# Patient Record
Sex: Male | Born: 1975 | Race: Black or African American | Hispanic: No | Smoking: Current every day smoker
Health system: Southern US, Community
[De-identification: ages and names within clinical notes are randomized; demographics above are authoritative.]

## PROBLEM LIST (undated history)

## (undated) DIAGNOSIS — F32A Depression, unspecified: Secondary | ICD-10-CM

## (undated) DIAGNOSIS — F419 Anxiety disorder, unspecified: Secondary | ICD-10-CM

---

## 2015-05-23 ENCOUNTER — Emergency Department (HOSPITAL_COMMUNITY)
Admission: EM | Admit: 2015-05-23 | Discharge: 2015-05-23 | Disposition: A | Discharge status: Other Acute Inpatient Hospital | Payer: BLUE CROSS/BLUE SHIELD | Attending: Emergency Medicine | Admitting: Emergency Medicine

## 2015-05-23 ENCOUNTER — Encounter (HOSPITAL_COMMUNITY): Payer: Self-pay | Admitting: *Deleted

## 2015-05-23 DIAGNOSIS — T3110 Burns involving 10-19% of body surface with 0% to 9% third degree burns: Secondary | ICD-10-CM

## 2015-05-23 DIAGNOSIS — T23272A Burn of second degree of left wrist, initial encounter: Secondary | ICD-10-CM | POA: Insufficient documentation

## 2015-05-23 DIAGNOSIS — T22221A Burn of second degree of right elbow, initial encounter: Secondary | ICD-10-CM | POA: Diagnosis not present

## 2015-05-23 DIAGNOSIS — X088XXA Exposure to other specified smoke, fire and flames, initial encounter: Secondary | ICD-10-CM | POA: Insufficient documentation

## 2015-05-23 DIAGNOSIS — T2121XA Burn of second degree of chest wall, initial encounter: Secondary | ICD-10-CM | POA: Insufficient documentation

## 2015-05-23 DIAGNOSIS — Y9289 Other specified places as the place of occurrence of the external cause: Secondary | ICD-10-CM | POA: Diagnosis not present

## 2015-05-23 DIAGNOSIS — Z720 Tobacco use: Secondary | ICD-10-CM | POA: Diagnosis not present

## 2015-05-23 DIAGNOSIS — F419 Anxiety disorder, unspecified: Secondary | ICD-10-CM | POA: Diagnosis not present

## 2015-05-23 DIAGNOSIS — T2101XA Burn of unspecified degree of chest wall, initial encounter: Secondary | ICD-10-CM | POA: Diagnosis present

## 2015-05-23 DIAGNOSIS — T23271A Burn of second degree of right wrist, initial encounter: Secondary | ICD-10-CM | POA: Diagnosis not present

## 2015-05-23 DIAGNOSIS — Y93G9 Activity, other involving cooking and grilling: Secondary | ICD-10-CM | POA: Diagnosis not present

## 2015-05-23 DIAGNOSIS — Z79899 Other long term (current) drug therapy: Secondary | ICD-10-CM | POA: Diagnosis not present

## 2015-05-23 DIAGNOSIS — Y998 Other external cause status: Secondary | ICD-10-CM | POA: Insufficient documentation

## 2015-05-23 DIAGNOSIS — T22222A Burn of second degree of left elbow, initial encounter: Secondary | ICD-10-CM | POA: Diagnosis not present

## 2015-05-23 HISTORY — DX: Anxiety disorder, unspecified: F41.9

## 2015-05-23 LAB — COMPREHENSIVE METABOLIC PANEL
ALT: 23 U/L (ref 17–63)
AST: 38 U/L (ref 15–41)
Albumin: 4.2 g/dL (ref 3.5–5.0)
Alkaline Phosphatase: 75 U/L (ref 38–126)
Anion gap: 12 (ref 5–15)
BUN: 8 mg/dL (ref 6–20)
CO2: 21 mmol/L — ABNORMAL LOW (ref 22–32)
CREATININE: 0.69 mg/dL (ref 0.61–1.24)
Calcium: 9.1 mg/dL (ref 8.9–10.3)
Chloride: 106 mmol/L (ref 101–111)
GFR calc Af Amer: >60 mL/min (ref >60)
GFR calc non Af Amer: >60 mL/min (ref >60)
GLUCOSE: 105 mg/dL — AB (ref 65–99)
Potassium: 3.2 mmol/L — ABNORMAL LOW (ref 3.5–5.1)
Sodium: 139 mmol/L (ref 135–145)
Total Bilirubin: 0.7 mg/dL (ref 0.3–1.2)
Total Protein: 7.1 g/dL (ref 6.5–8.1)

## 2015-05-23 LAB — RAPID URINE DRUG SCREEN, HOSP PERFORMED
Amphetamines: NOT DETECTED
BENZODIAZEPINES: NOT DETECTED
Barbiturates: NOT DETECTED
Cocaine: NOT DETECTED
Opiates: NOT DETECTED
Tetrahydrocannabinol: NOT DETECTED

## 2015-05-23 LAB — I-STAT CHEM 8, ED
BUN: 8 mg/dL (ref 6–20)
CALCIUM ION: 1.08 mmol/L — AB (ref 1.12–1.23)
CHLORIDE: 106 mmol/L (ref 101–111)
Creatinine, Ser: 0.9 mg/dL (ref 0.61–1.24)
GLUCOSE: 102 mg/dL — AB (ref 65–99)
HEMATOCRIT: 42 % (ref 39.0–52.0)
HEMOGLOBIN: 14.3 g/dL (ref 13.0–17.0)
Potassium: 3.2 mmol/L — ABNORMAL LOW (ref 3.5–5.1)
Sodium: 141 mmol/L (ref 135–145)
TCO2: 18 mmol/L (ref 0–100)

## 2015-05-23 LAB — PROTIME-INR
INR: 1.09 (ref 0.00–1.49)
Prothrombin Time: 14.3 seconds (ref 11.6–15.2)

## 2015-05-23 LAB — PREPARE FRESH FROZEN PLASMA
UNIT DIVISION: 0
Unit division: 0

## 2015-05-23 LAB — CBC
HCT: 39.3 % (ref 39.0–52.0)
HEMOGLOBIN: 13.6 g/dL (ref 13.0–17.0)
MCH: 32.2 pg (ref 26.0–34.0)
MCHC: 34.6 g/dL (ref 30.0–36.0)
MCV: 93.1 fL (ref 78.0–100.0)
PLATELETS: 277 10*3/uL (ref 150–400)
RBC: 4.22 MIL/uL (ref 4.22–5.81)
RDW: 13.1 % (ref 11.5–15.5)
WBC: 5.7 10*3/uL (ref 4.0–10.5)

## 2015-05-23 LAB — CDS SEROLOGY

## 2015-05-23 LAB — I-STAT CG4 LACTIC ACID, ED: Lactic Acid, Venous: 2.52 mmol/L (ref 0.5–2.0)

## 2015-05-23 MED ORDER — HYDROMORPHONE HCL 1 MG/ML IJ SOLN
1.0000 mg | Freq: Once | INTRAMUSCULAR | Status: AC
  Administered 2015-05-23: 1 mg via INTRAVENOUS
  Filled 2015-05-23: qty 1

## 2015-05-23 MED ORDER — LACTATED RINGERS IV SOLN
INTRAVENOUS | Status: DC
  Administered 2015-05-23: 18:00:00 via INTRAVENOUS
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
## 2015-05-23 NOTE — ED Notes (Signed)
Pt ambulated to the bathroom with ease 
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
## 2015-05-23 NOTE — ED Notes (Signed)
[facility] at bedside 

## 2017-11-30 ENCOUNTER — Emergency Department (HOSPITAL_COMMUNITY)
Admission: EM | Admit: 2017-11-30 | Discharge: 2017-11-30 | Disposition: A | Discharge status: Home / Self Care | Payer: Managed Care, Other (non HMO) | Attending: Emergency Medicine | Admitting: Emergency Medicine

## 2017-11-30 ENCOUNTER — Emergency Department (HOSPITAL_COMMUNITY): Payer: Managed Care, Other (non HMO)

## 2017-11-30 ENCOUNTER — Other Ambulatory Visit: Payer: Self-pay

## 2017-11-30 ENCOUNTER — Encounter (HOSPITAL_COMMUNITY): Payer: Self-pay | Admitting: Emergency Medicine

## 2017-11-30 DIAGNOSIS — Y998 Other external cause status: Secondary | ICD-10-CM | POA: Diagnosis not present

## 2017-11-30 DIAGNOSIS — Y92008 Other place in unspecified non-institutional (private) residence as the place of occurrence of the external cause: Secondary | ICD-10-CM | POA: Diagnosis not present

## 2017-11-30 DIAGNOSIS — F17210 Nicotine dependence, cigarettes, uncomplicated: Secondary | ICD-10-CM | POA: Insufficient documentation

## 2017-11-30 DIAGNOSIS — M79632 Pain in left forearm: Secondary | ICD-10-CM | POA: Diagnosis not present

## 2017-11-30 DIAGNOSIS — M79642 Pain in left hand: Secondary | ICD-10-CM | POA: Insufficient documentation

## 2017-11-30 DIAGNOSIS — S92414A Nondisplaced fracture of proximal phalanx of right great toe, initial encounter for closed fracture: Secondary | ICD-10-CM | POA: Insufficient documentation

## 2017-11-30 DIAGNOSIS — Y9389 Activity, other specified: Secondary | ICD-10-CM | POA: Diagnosis not present

## 2017-11-30 DIAGNOSIS — W19XXXA Unspecified fall, initial encounter: Secondary | ICD-10-CM

## 2017-11-30 DIAGNOSIS — S92404A Nondisplaced unspecified fracture of right great toe, initial encounter for closed fracture: Secondary | ICD-10-CM

## 2017-11-30 DIAGNOSIS — S2242XA Multiple fractures of ribs, left side, initial encounter for closed fracture: Secondary | ICD-10-CM

## 2017-11-30 DIAGNOSIS — S299XXA Unspecified injury of thorax, initial encounter: Secondary | ICD-10-CM | POA: Diagnosis present

## 2017-11-30 MED ORDER — IBUPROFEN 800 MG PO TABS: 800.0000 mg | ORAL_TABLET | Freq: Three times a day (TID) | ORAL | 0 refills | Status: DC | PRN

## 2017-11-30 MED ORDER — OXYCODONE-ACETAMINOPHEN 5-325 MG PO TABS
1.0000 | ORAL_TABLET | Freq: Once | ORAL | Status: AC
  Administered 2017-11-30: 1 via ORAL
  Filled 2017-11-30: qty 1

## 2017-11-30 MED ORDER — HYDROCODONE-ACETAMINOPHEN 5-325 MG PO TABS: 1.0000 | ORAL_TABLET | Freq: Four times a day (QID) | ORAL | 0 refills | Status: DC | PRN

## 2017-11-30 MED ORDER — ONDANSETRON 4 MG PO TBDP
4.0000 mg | ORAL_TABLET | Freq: Once | ORAL | Status: AC
  Administered 2017-11-30: 4 mg via ORAL
  Filled 2017-11-30: qty 1

## 2017-11-30 NOTE — Discharge Instructions (Signed)
Return here as needed follow-up as needed with the orthopedist.  Ice and elevate the areas that are sore.

## 2017-11-30 NOTE — Progress Notes (Signed)
Orthopedic Tech Progress Note Patient Details:  Damon Olson 1976-09-26 [id number]  Ortho Devices Type of Ortho Device: Arm sling, Buddy tape, Postop shoe/boot, Crutches Ortho Device/Splint Location: lue/rle Ortho Device/Splint Interventions: Application   Post Interventions Patient Tolerated: Well Instructions Provided: Care of device   [name], [name] 11/30/2017, 9:55 AM
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
## 2017-11-30 NOTE — ED Notes (Signed)
Pt back from x-ray.

## 2017-11-30 NOTE — ED Notes (Signed)
Pt to xray
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
## 2017-12-18 ENCOUNTER — Emergency Department (HOSPITAL_COMMUNITY): Payer: Managed Care, Other (non HMO)

## 2017-12-18 ENCOUNTER — Encounter (HOSPITAL_COMMUNITY): Payer: Self-pay

## 2017-12-18 DIAGNOSIS — J189 Pneumonia, unspecified organism: Secondary | ICD-10-CM | POA: Diagnosis not present

## 2017-12-18 DIAGNOSIS — Z79899 Other long term (current) drug therapy: Secondary | ICD-10-CM | POA: Diagnosis not present

## 2017-12-18 DIAGNOSIS — F17210 Nicotine dependence, cigarettes, uncomplicated: Secondary | ICD-10-CM | POA: Diagnosis not present

## 2017-12-18 DIAGNOSIS — R0602 Shortness of breath: Secondary | ICD-10-CM | POA: Diagnosis present
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
## 2017-12-19 ENCOUNTER — Emergency Department (HOSPITAL_COMMUNITY)
Admission: EM | Admit: 2017-12-19 | Discharge: 2017-12-19 | Disposition: A | Discharge status: Home / Self Care | Payer: Managed Care, Other (non HMO) | Attending: Physician Assistant | Admitting: Physician Assistant

## 2017-12-19 ENCOUNTER — Other Ambulatory Visit: Payer: Self-pay

## 2017-12-19 DIAGNOSIS — J189 Pneumonia, unspecified organism: Secondary | ICD-10-CM

## 2017-12-19 MED ORDER — GUAIFENESIN-CODEINE 100-10 MG/5ML PO SOLN: 5.0000 mL | Freq: Four times a day (QID) | ORAL | 0 refills | Status: DC | PRN

## 2017-12-19 MED ORDER — IBUPROFEN 800 MG PO TABS
800.0000 mg | ORAL_TABLET | Freq: Once | ORAL | Status: AC
  Administered 2017-12-19: 800 mg via ORAL
  Filled 2017-12-19: qty 1

## 2017-12-19 MED ORDER — AZITHROMYCIN 250 MG PO TABS
500.0000 mg | ORAL_TABLET | Freq: Once | ORAL | Status: AC
Start: 2017-12-19 — End: 2017-12-19
  Administered 2017-12-19: 500 mg via ORAL
  Filled 2017-12-19: qty 2

## 2017-12-19 MED ORDER — AZITHROMYCIN 250 MG PO TABS: ORAL_TABLET | ORAL | 0 refills | Status: DC

## 2017-12-19 MED ORDER — ONDANSETRON 4 MG PO TBDP
4.0000 mg | ORAL_TABLET | Freq: Once | ORAL | Status: AC
Start: 2017-12-19 — End: 2017-12-19
  Administered 2017-12-19: 4 mg via ORAL
  Filled 2017-12-19: qty 1

## 2017-12-19 NOTE — Discharge Instructions (Signed)
You were seen today and found to have pneumonia.  Please continue to take deep breaths.  Take these antibiotics and use the cough syrup as needed.  Please return if not improving within 24-48 hours.  To find a primary care or specialty doctor please call [phone] or [phone] to access "[facility] Find a Doctor Service."  You may also go on the [facility] website at [email or web address]  There are also multiple [facility] practices throughout the [place] that are frequently accepting new patients. You may find a clinic that is close to your home and contact them.  [facility] -  [address] [place] [id number] [phone]  [facility] in [place] (also locations in [place] and [place]) -  [address] [place] [address] [phone]  [facility] -  [address] [place] [address] [phone]

## 2017-12-19 NOTE — ED Notes (Signed)
Pt able to take PO medications without difficulty. Pt drinking gingerale and water. Pt tolerating well. Will continue to monitor.
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
## 2017-12-19 NOTE — ED Notes (Signed)
Pt verbalizes understanding of d/c instructions. Pt received prescriptions. Pt ambulatory at d/c with all belongings.  
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
## 2019-04-16 IMAGING — CR DG RIBS 2V*L*
4 series · 4 of 4 positions shown · non-contrast
Comparison: Chest x-ray of November 30, 2017

CLINICAL DATA: Left lower anterior rib cage pain and shortness of
breath. Current smoker. Recent fall.

EXAM:
LEFT RIBS - 2 VIEW

[rib ap (1 of 2)]
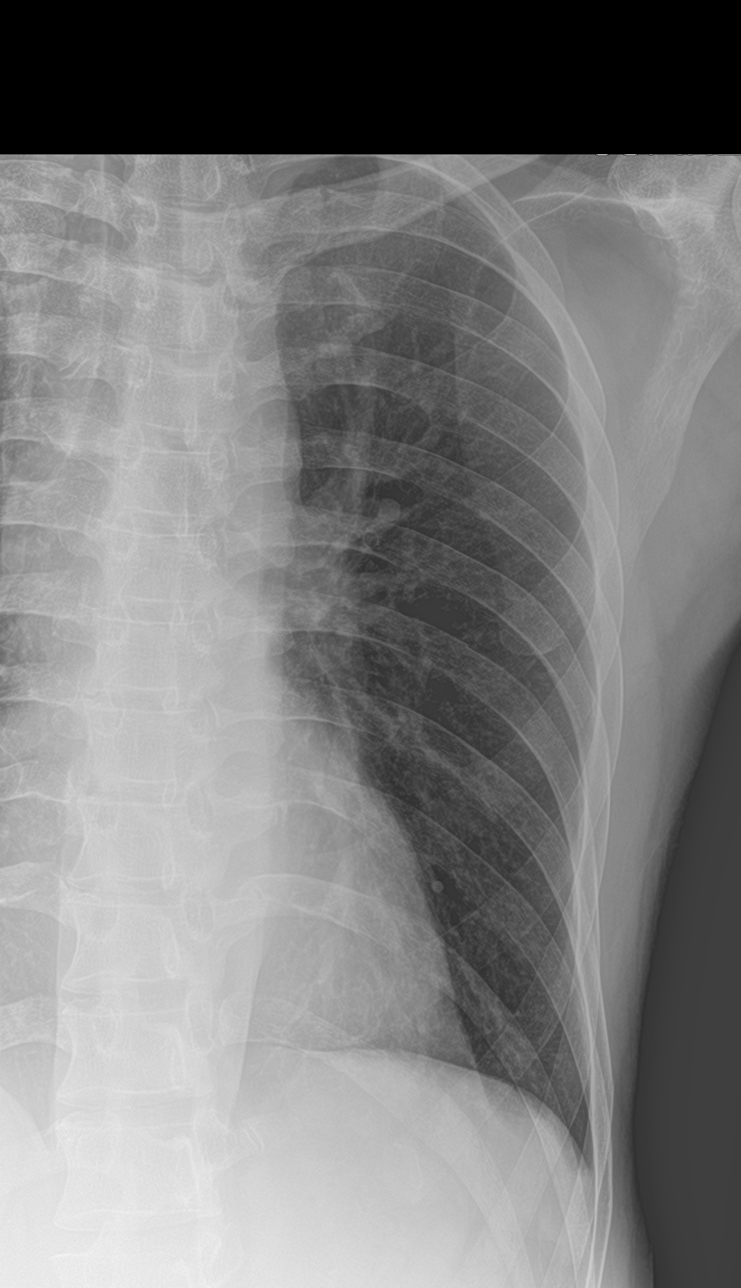

[rib ap obl (1 of 2)]
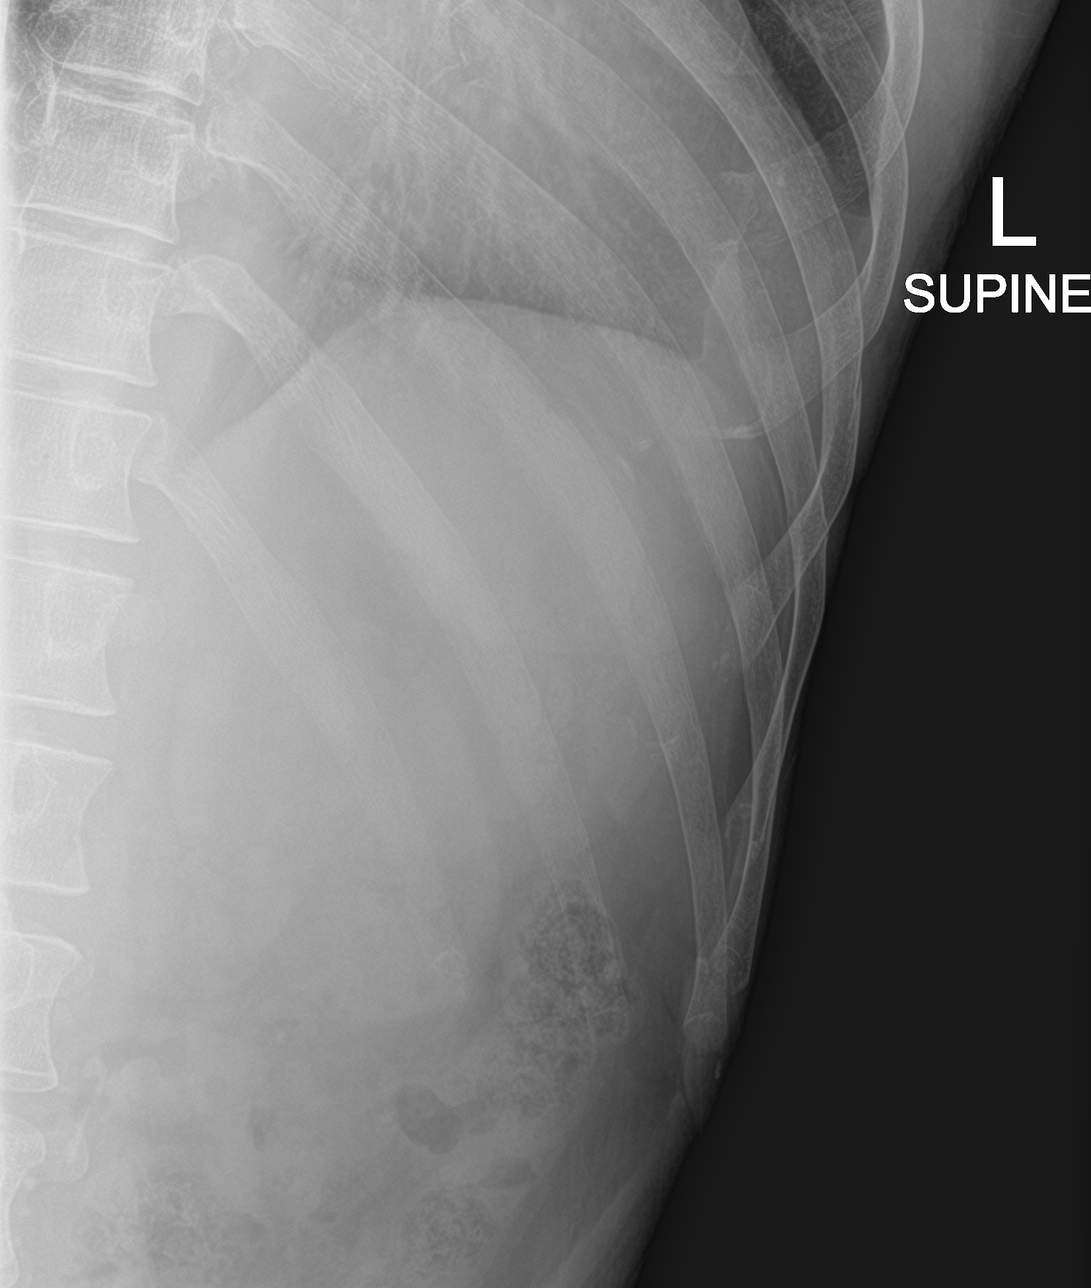

[rib ap (2 of 2)]
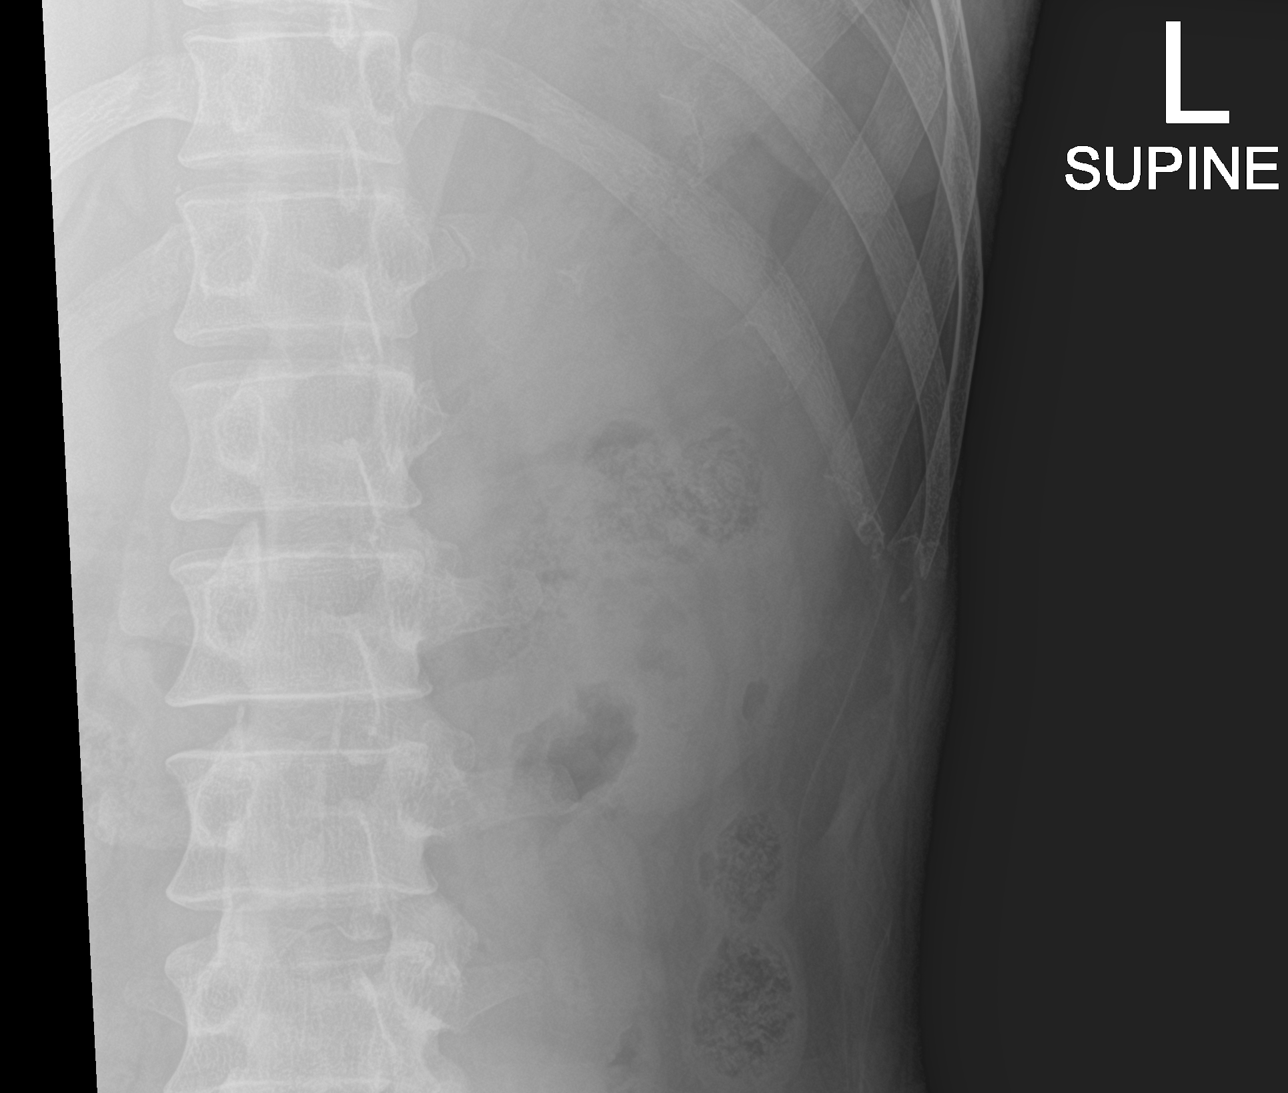

[rib ap obl (2 of 2)]
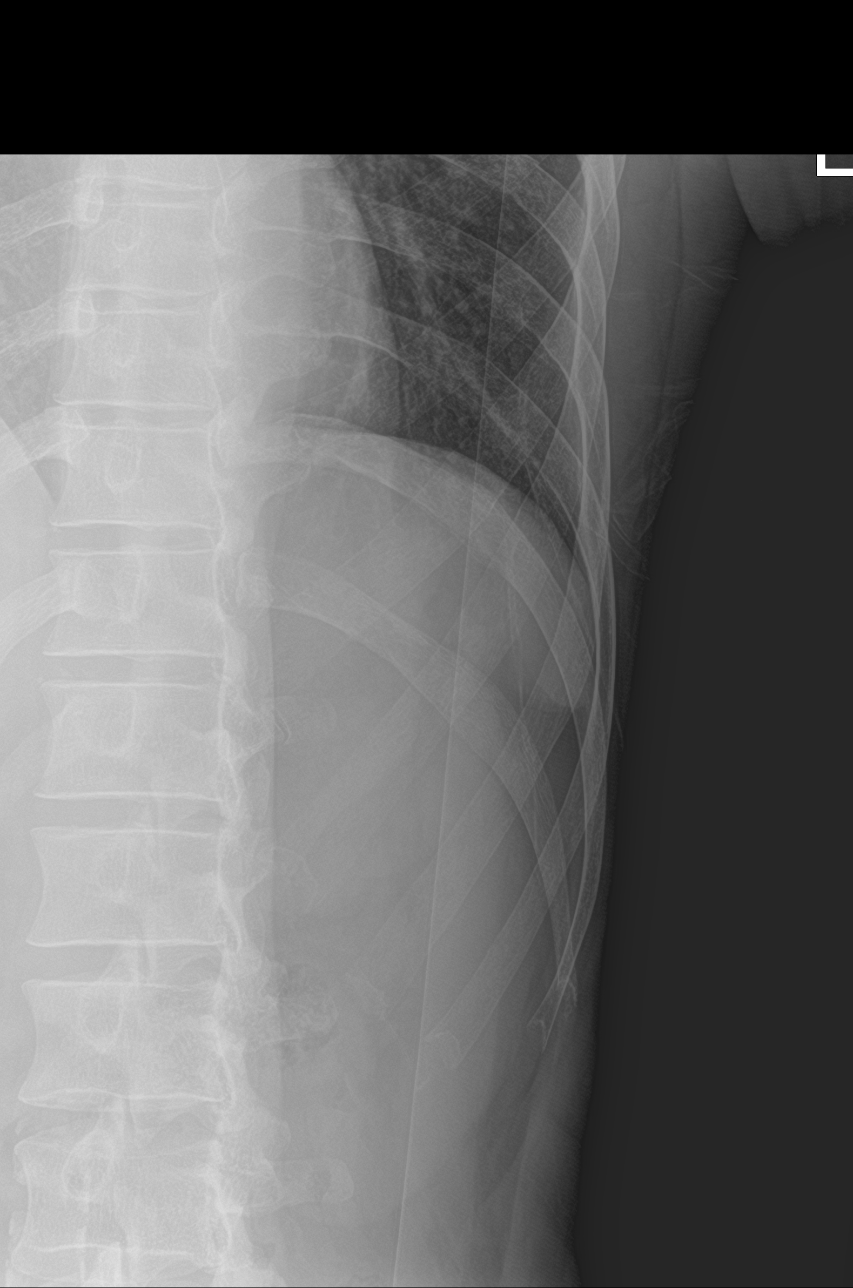

[4 of 4 positions shown; findings below may reference images not displayed]

FINDINGS: The left lung is well-expanded and clear. There is no pleural
effusion or pneumothorax. There is subtle cortical irregularity of
the lateral aspects of the left seventh and eighth ribs. No acute
displaced fracture is observed.
IMPRESSION: Possible nondisplaced fractures of the anterolateral aspects of the
left seventh and eighth ribs.
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
## 2021-05-05 ENCOUNTER — Emergency Department (HOSPITAL_COMMUNITY)
Admission: EM | Admit: 2021-05-05 | Discharge: 2021-05-06 | Disposition: A | Discharge status: Other Acute Inpatient Hospital | Payer: Managed Care, Other (non HMO) | Attending: Emergency Medicine | Admitting: Emergency Medicine

## 2021-05-05 ENCOUNTER — Encounter (HOSPITAL_COMMUNITY): Payer: Self-pay

## 2021-05-05 ENCOUNTER — Other Ambulatory Visit: Payer: Self-pay

## 2021-05-05 DIAGNOSIS — Z046 Encounter for general psychiatric examination, requested by authority: Secondary | ICD-10-CM

## 2021-05-05 DIAGNOSIS — Y904 Blood alcohol level of 80-99 mg/100 ml: Secondary | ICD-10-CM | POA: Insufficient documentation

## 2021-05-05 DIAGNOSIS — R45850 Homicidal ideations: Secondary | ICD-10-CM | POA: Insufficient documentation

## 2021-05-05 DIAGNOSIS — Z20822 Contact with and (suspected) exposure to covid-19: Secondary | ICD-10-CM | POA: Insufficient documentation

## 2021-05-05 DIAGNOSIS — F17210 Nicotine dependence, cigarettes, uncomplicated: Secondary | ICD-10-CM | POA: Insufficient documentation

## 2021-05-05 DIAGNOSIS — F1024 Alcohol dependence with alcohol-induced mood disorder: Secondary | ICD-10-CM | POA: Insufficient documentation

## 2021-05-05 LAB — CBC
HCT: 40.8 % (ref 39.0–52.0)
Hemoglobin: 13.6 g/dL (ref 13.0–17.0)
MCH: 33.1 pg (ref 26.0–34.0)
MCHC: 33.3 g/dL (ref 30.0–36.0)
MCV: 99.3 fL (ref 80.0–100.0)
Platelets: 265 10*3/uL (ref 150–400)
RBC: 4.11 MIL/uL — ABNORMAL LOW (ref 4.22–5.81)
RDW: 13.2 % (ref 11.5–15.5)
WBC: 4.3 10*3/uL (ref 4.0–10.5)
nRBC: 0 % (ref 0.0–0.2)

## 2021-05-05 LAB — COMPREHENSIVE METABOLIC PANEL
ALT: 27 U/L (ref 0–44)
AST: 35 U/L (ref 15–41)
Albumin: 4 g/dL (ref 3.5–5.0)
Alkaline Phosphatase: 57 U/L (ref 38–126)
Anion gap: 11 (ref 5–15)
BUN: 9 mg/dL (ref 6–20)
CO2: 22 mmol/L (ref 22–32)
Calcium: 8.6 mg/dL — ABNORMAL LOW (ref 8.9–10.3)
Chloride: 109 mmol/L (ref 98–111)
Creatinine, Ser: 0.66 mg/dL (ref 0.61–1.24)
GFR, Estimated: >60 mL/min (ref >60)
Glucose, Bld: 110 mg/dL — ABNORMAL HIGH (ref 70–99)
Potassium: 3.3 mmol/L — ABNORMAL LOW (ref 3.5–5.1)
Sodium: 142 mmol/L (ref 135–145)
Total Bilirubin: 1 mg/dL (ref 0.3–1.2)
Total Protein: 6.4 g/dL — ABNORMAL LOW (ref 6.5–8.1)

## 2021-05-05 LAB — RESP PANEL BY RT-PCR (FLU A&B, COVID) ARPGX2
Influenza A by PCR: NEGATIVE
Influenza B by PCR: NEGATIVE
SARS Coronavirus 2 by RT PCR: NEGATIVE

## 2021-05-05 LAB — RAPID URINE DRUG SCREEN, HOSP PERFORMED
Amphetamines: NOT DETECTED
Barbiturates: NOT DETECTED
Benzodiazepines: NOT DETECTED
Cocaine: NOT DETECTED
Opiates: NOT DETECTED
Tetrahydrocannabinol: POSITIVE — AB

## 2021-05-05 LAB — ACETAMINOPHEN LEVEL: Acetaminophen (Tylenol), Serum: <10 ug/mL — ABNORMAL LOW (ref 10–30)

## 2021-05-05 LAB — ETHANOL: Alcohol, Ethyl (B): 82 mg/dL — ABNORMAL HIGH (ref <10)

## 2021-05-05 LAB — SALICYLATE LEVEL: Salicylate Lvl: <7 mg/dL — ABNORMAL LOW (ref 7.0–30.0)
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
## 2021-05-05 NOTE — ED Notes (Signed)
Pt changed into burgundy scrubs. Calm and cooperative. Laying on recliner, [employer] with pt.

## 2021-05-05 NOTE — ED Notes (Signed)
wanded by security 
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
## 2021-05-06 ENCOUNTER — Other Ambulatory Visit: Payer: Self-pay

## 2021-05-06 ENCOUNTER — Inpatient Hospital Stay (HOSPITAL_COMMUNITY)
Admission: AD | Admit: 2021-05-06 | Discharge: 2021-05-13 | DRG: 897 | Disposition: A | Discharge status: Home / Self Care | Payer: Federal, State, Local not specified - Other | Source: Intra-hospital | Attending: Behavioral Health | Admitting: Behavioral Health

## 2021-05-06 ENCOUNTER — Encounter (HOSPITAL_COMMUNITY): Payer: Self-pay | Admitting: Student

## 2021-05-06 DIAGNOSIS — T1490XA Injury, unspecified, initial encounter: Secondary | ICD-10-CM

## 2021-05-06 DIAGNOSIS — F329 Major depressive disorder, single episode, unspecified: Secondary | ICD-10-CM | POA: Diagnosis present

## 2021-05-06 DIAGNOSIS — F1994 Other psychoactive substance use, unspecified with psychoactive substance-induced mood disorder: Secondary | ICD-10-CM | POA: Diagnosis present

## 2021-05-06 DIAGNOSIS — F1020 Alcohol dependence, uncomplicated: Secondary | ICD-10-CM | POA: Diagnosis present

## 2021-05-06 DIAGNOSIS — G4700 Insomnia, unspecified: Secondary | ICD-10-CM | POA: Diagnosis present

## 2021-05-06 DIAGNOSIS — R45850 Homicidal ideations: Secondary | ICD-10-CM | POA: Diagnosis present

## 2021-05-06 DIAGNOSIS — F419 Anxiety disorder, unspecified: Secondary | ICD-10-CM | POA: Diagnosis present

## 2021-05-06 DIAGNOSIS — F1290 Cannabis use, unspecified, uncomplicated: Secondary | ICD-10-CM | POA: Diagnosis not present

## 2021-05-06 DIAGNOSIS — Z681 Body mass index (BMI) 19 or less, adult: Secondary | ICD-10-CM | POA: Diagnosis not present

## 2021-05-06 DIAGNOSIS — F39 Unspecified mood [affective] disorder: Secondary | ICD-10-CM | POA: Diagnosis not present

## 2021-05-06 DIAGNOSIS — R636 Underweight: Secondary | ICD-10-CM | POA: Diagnosis present

## 2021-05-06 DIAGNOSIS — F1024 Alcohol dependence with alcohol-induced mood disorder: Principal | ICD-10-CM | POA: Diagnosis present

## 2021-05-06 DIAGNOSIS — F410 Panic disorder [episodic paroxysmal anxiety] without agoraphobia: Secondary | ICD-10-CM | POA: Diagnosis present

## 2021-05-06 DIAGNOSIS — Y904 Blood alcohol level of 80-99 mg/100 ml: Secondary | ICD-10-CM | POA: Diagnosis present

## 2021-05-06 DIAGNOSIS — F17210 Nicotine dependence, cigarettes, uncomplicated: Secondary | ICD-10-CM | POA: Diagnosis present

## 2021-05-06 HISTORY — DX: Panic disorder (episodic paroxysmal anxiety): F41.0

## 2021-05-06 HISTORY — DX: Depression, unspecified: F32.A

## 2021-05-06 HISTORY — DX: Unspecified mood (affective) disorder: F39

## 2021-05-06 HISTORY — DX: Alcohol dependence, uncomplicated: F10.20

## 2021-05-06 HISTORY — DX: Cannabis use, unspecified, uncomplicated: F12.90

## 2021-05-06 LAB — COMPREHENSIVE METABOLIC PANEL
ALT: 30 U/L (ref 0–44)
AST: 34 U/L (ref 15–41)
Albumin: 4.5 g/dL (ref 3.5–5.0)
Alkaline Phosphatase: 52 U/L (ref 38–126)
Anion gap: 9 (ref 5–15)
BUN: 8 mg/dL (ref 6–20)
CO2: 25 mmol/L (ref 22–32)
Calcium: 9.5 mg/dL (ref 8.9–10.3)
Chloride: 104 mmol/L (ref 98–111)
Creatinine, Ser: 0.65 mg/dL (ref 0.61–1.24)
GFR, Estimated: >60 mL/min (ref >60)
Glucose, Bld: 152 mg/dL — ABNORMAL HIGH (ref 70–99)
Potassium: 3.1 mmol/L — ABNORMAL LOW (ref 3.5–5.1)
Sodium: 138 mmol/L (ref 135–145)
Total Bilirubin: 1.2 mg/dL (ref 0.3–1.2)
Total Protein: 7.5 g/dL (ref 6.5–8.1)

## 2021-05-06 LAB — LIPID PANEL
Cholesterol: 176 mg/dL (ref 0–200)
HDL: 62 mg/dL (ref >40)
LDL Cholesterol: UNDETERMINED mg/dL (ref 0–99)
Total CHOL/HDL Ratio: 2.8 RATIO
Triglycerides: 582 mg/dL — ABNORMAL HIGH (ref <150)
VLDL: UNDETERMINED mg/dL (ref 0–40)

## 2021-05-06 LAB — HEMOGLOBIN A1C
Hgb A1c MFr Bld: 5.4 % (ref 4.8–5.6)
Mean Plasma Glucose: 108.28 mg/dL

## 2021-05-06 LAB — TSH: TSH: 2.472 u[IU]/mL (ref 0.350–4.500)

## 2021-05-06 LAB — LDL CHOLESTEROL, DIRECT: Direct LDL: 69.9 mg/dL (ref 0–99)

## 2021-05-06 MED ORDER — IBUPROFEN 400 MG PO TABS: 600.0000 mg | ORAL_TABLET | Freq: Three times a day (TID) | ORAL | Status: DC | PRN

## 2021-05-06 MED ORDER — MAGNESIUM HYDROXIDE 400 MG/5ML PO SUSP: 30.0000 mL | Freq: Every day | ORAL | Status: DC | PRN

## 2021-05-06 MED ORDER — LORAZEPAM 1 MG PO TABS
1.0000 mg | ORAL_TABLET | Freq: Three times a day (TID) | ORAL | Status: DC
  Administered 2021-05-06: 1 mg via ORAL
  Filled 2021-05-06 (×2): qty 1

## 2021-05-06 MED ORDER — HYDROXYZINE HCL 25 MG PO TABS
25.0000 mg | ORAL_TABLET | Freq: Three times a day (TID) | ORAL | Status: DC | PRN
  Administered 2021-05-10 – 2021-05-12 (×3): 25 mg via ORAL
  Filled 2021-05-06 (×3): qty 1

## 2021-05-06 MED ORDER — LOPERAMIDE HCL 2 MG PO CAPS
2.0000 mg | ORAL_CAPSULE | ORAL | Status: AC | PRN
Start: 2021-05-06 — End: 2021-05-09

## 2021-05-06 MED ORDER — LORAZEPAM 1 MG PO TABS: 1.0000 mg | ORAL_TABLET | Freq: Two times a day (BID) | ORAL | Status: DC

## 2021-05-06 MED ORDER — ZIPRASIDONE MESYLATE 20 MG IM SOLR: 20.0000 mg | Freq: Four times a day (QID) | INTRAMUSCULAR | Status: AC | PRN

## 2021-05-06 MED ORDER — LORAZEPAM 0.5 MG PO TABS: 0.5000 mg | ORAL_TABLET | Freq: Two times a day (BID) | ORAL | Status: DC

## 2021-05-06 MED ORDER — ONDANSETRON HCL 4 MG PO TABS: 4.0000 mg | ORAL_TABLET | Freq: Three times a day (TID) | ORAL | Status: DC | PRN

## 2021-05-06 MED ORDER — POTASSIUM CHLORIDE CRYS ER 20 MEQ PO TBCR
40.0000 meq | EXTENDED_RELEASE_TABLET | Freq: Once | ORAL | Status: AC
  Administered 2021-05-06: 40 meq via ORAL
  Filled 2021-05-06: qty 2

## 2021-05-06 MED ORDER — ADULT MULTIVITAMIN W/MINERALS CH
1.0000 | ORAL_TABLET | Freq: Every day | ORAL | Status: DC
  Administered 2021-05-06 – 2021-05-13 (×7): 1 via ORAL
  Filled 2021-05-06 (×11): qty 1

## 2021-05-06 MED ORDER — THIAMINE HCL 100 MG PO TABS
100.0000 mg | ORAL_TABLET | Freq: Every day | ORAL | Status: DC
  Administered 2021-05-06 – 2021-05-13 (×7): 100 mg via ORAL
  Filled 2021-05-06 (×11): qty 1

## 2021-05-06 MED ORDER — LORAZEPAM 1 MG PO TABS: 1.0000 mg | ORAL_TABLET | Freq: Three times a day (TID) | ORAL | Status: DC

## 2021-05-06 MED ORDER — NICOTINE 21 MG/24HR TD PT24: 21.0000 mg | MEDICATED_PATCH | Freq: Every day | TRANSDERMAL | Status: DC

## 2021-05-06 MED ORDER — ALUM & MAG HYDROXIDE-SIMETH 200-200-20 MG/5ML PO SUSP: 30.0000 mL | Freq: Four times a day (QID) | ORAL | Status: DC | PRN

## 2021-05-06 MED ORDER — ACETAMINOPHEN 325 MG PO TABS
650.0000 mg | ORAL_TABLET | Freq: Four times a day (QID) | ORAL | Status: DC | PRN
  Administered 2021-05-12: 650 mg via ORAL
  Filled 2021-05-06: qty 2

## 2021-05-06 MED ORDER — OLANZAPINE 5 MG PO TBDP: 5.0000 mg | ORAL_TABLET | Freq: Three times a day (TID) | ORAL | Status: DC | PRN

## 2021-05-06 MED ORDER — ZOLPIDEM TARTRATE 5 MG PO TABS: 5.0000 mg | ORAL_TABLET | Freq: Every evening | ORAL | Status: DC | PRN

## 2021-05-06 MED ORDER — ALUM & MAG HYDROXIDE-SIMETH 200-200-20 MG/5ML PO SUSP: 30.0000 mL | ORAL | Status: DC | PRN

## 2021-05-06 MED ORDER — ONDANSETRON 4 MG PO TBDP: 4.0000 mg | ORAL_TABLET | Freq: Four times a day (QID) | ORAL | Status: AC | PRN

## 2021-05-06 MED ORDER — TRAZODONE HCL 50 MG PO TABS
50.0000 mg | ORAL_TABLET | Freq: Every evening | ORAL | Status: DC | PRN
  Administered 2021-05-10 – 2021-05-12 (×3): 50 mg via ORAL
  Filled 2021-05-06 (×4): qty 1
  Filled 2021-05-06: qty 7

## 2021-05-06 MED ORDER — OLANZAPINE 7.5 MG PO TABS
7.5000 mg | ORAL_TABLET | Freq: Every day | ORAL | Status: DC
  Administered 2021-05-07 – 2021-05-08 (×2): 7.5 mg via ORAL
  Filled 2021-05-06 (×4): qty 1
  Filled 2021-05-06: qty 3
  Filled 2021-05-06: qty 1

## 2021-05-06 MED ORDER — LORAZEPAM 1 MG PO TABS
1.0000 mg | ORAL_TABLET | Freq: Four times a day (QID) | ORAL | Status: AC | PRN
  Administered 2021-05-11: 1 mg via ORAL
  Filled 2021-05-06: qty 1

## 2021-05-06 MED ORDER — LORAZEPAM 1 MG PO TABS: 1.0000 mg | ORAL_TABLET | Freq: Four times a day (QID) | ORAL | Status: AC | PRN
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
## 2021-05-06 NOTE — Tx Team (Signed)
Initial Treatment Plan 05/06/2021 4:22 PM Damon Olson BBC:[id number]    PATIENT STRESSORS: Marital or family conflict Occupational concerns Substance abuse   PATIENT STRENGTHS: General fund of knowledge Motivation for treatment/growth Physical Health Supportive family/friends Work skills   PATIENT IDENTIFIED PROBLEMS: "Panic"  "depression"  "anxiety"  "Alcohol"                DISCHARGE CRITERIA:  Ability to meet basic life and health needs Adequate post-discharge living arrangements Improved stabilization in mood, thinking, and/or behavior Medical problems require only outpatient monitoring Motivation to continue treatment in a less acute level of care Need for constant or close observation no longer present Reduction of life-threatening or endangering symptoms to within safe limits Safe-care adequate arrangements made Verbal commitment to aftercare and medication compliance Withdrawal symptoms are absent or subacute and managed without 24-hour nursing intervention  PRELIMINARY DISCHARGE PLAN: Attend aftercare/continuing care group Attend PHP/IOP Attend 12-step recovery group Outpatient therapy Return to previous living arrangement Return to previous work or school arrangements  PATIENT/FAMILY INVOLVEMENT: This treatment plan has been presented to and reviewed with the patient, Damon Olson.  The patient and family have been given the opportunity to ask questions and make suggestions.  Quintella Reichert [place], [place] 05/06/2021, 4:22 PM

## 2021-05-06 NOTE — Tx Team (Signed)
Interdisciplinary Treatment and Diagnostic Plan Update  05/06/2021 Time of Session: 9:05am Damon Olson MRN: [id number]  Principal Diagnosis: <principal problem not specified>  Secondary Diagnoses: Active Problems:   Substance induced mood disorder (HCC)   Current Medications:  Current Facility-Administered Medications  Medication Dose Route Frequency Provider Last Rate Last Admin  . acetaminophen (TYLENOL) tablet 650 mg  650 mg Oral Q6H PRN Prescilla Sours, PA-C      . alum & mag hydroxide-simeth (MAALOX/MYLANTA) 200-200-20 MG/5ML suspension 30 mL  30 mL Oral Q4H PRN Margorie John W, PA-C      . hydrOXYzine (ATARAX/VISTARIL) tablet 25 mg  25 mg Oral TID PRN Prescilla Sours, PA-C      . loperamide (IMODIUM) capsule 2-4 mg  2-4 mg Oral PRN Sharma Covert, MD      . LORazepam (ATIVAN) tablet 1 mg  1 mg Oral Q6H PRN Sharma Covert, MD      . OLANZapine zydis (ZYPREXA) disintegrating tablet 5 mg  5 mg Oral Q8H PRN Sharma Covert, MD       And  . LORazepam (ATIVAN) tablet 1 mg  1 mg Oral Q6H PRN Sharma Covert, MD       And  . ziprasidone (GEODON) injection 20 mg  20 mg Intramuscular Q6H PRN Sharma Covert, MD      . magnesium hydroxide (MILK OF MAGNESIA) suspension 30 mL  30 mL Oral Daily PRN Margorie John W, PA-C      . multivitamin with minerals tablet 1 tablet  1 tablet Oral Daily Sharma Covert, MD   1 tablet at 05/06/21 0900  . ondansetron (ZOFRAN-ODT) disintegrating tablet 4 mg  4 mg Oral Q6H PRN Sharma Covert, MD      . thiamine tablet 100 mg  100 mg Oral Daily Sharma Covert, MD   100 mg at 05/06/21 0900  . traZODone (DESYREL) tablet 50 mg  50 mg Oral QHS PRN Prescilla Sours, PA-C       PTA Medications: Medications Prior to Admission  Medication Sig Dispense Refill Last Dose  . ALPRAZolam (XANAX) 0.5 MG tablet Take 0.5 mg by mouth 2 (two) times daily as needed for anxiety.   Past Month  . ascorbic acid (VITAMIN C) 100 MG tablet Take 100 mg by mouth  daily.   05/05/2021  . citalopram (CELEXA) 20 MG tablet Take 20 mg by mouth daily.   05/06/2021  . Ergocalciferol (VITAMIN D2 PO) Take 1 tablet by mouth daily.   05/05/2021  . MILK THISTLE PO Take 1 capsule by mouth daily.   05/05/2021  . Multiple Vitamin (MULTIVITAMIN WITH MINERALS) TABS tablet Take 1 tablet by mouth daily.   05/05/2021  . Omega-3 1000 MG CAPS Take 1,000 mg by mouth daily.   05/05/2021    Patient Stressors:    Patient Strengths:    Treatment Modalities: Medication Management, Group therapy, Case management,  1 to 1 session with clinician, Psychoeducation, Recreational therapy.   Physician Treatment Plan for Primary Diagnosis: <principal problem not specified> Long Term Goal(s):     Short Term Goals:    Medication Management: Evaluate patient's response, side effects, and tolerance of medication regimen.  Therapeutic Interventions: 1 to 1 sessions, Unit Group sessions and Medication administration.  Evaluation of Outcomes: Not Met  Physician Treatment Plan for Secondary Diagnosis: Active Problems:   Substance induced mood disorder ([place])  Long Term Goal(s):     Short Term Goals:  Medication Management: Evaluate patient's response, side effects, and tolerance of medication regimen.  Therapeutic Interventions: 1 to 1 sessions, Unit Group sessions and Medication administration.  Evaluation of Outcomes: Not Met   RN Treatment Plan for Primary Diagnosis: <principal problem not specified> Long Term Goal(s): Knowledge of disease and therapeutic regimen to maintain health will improve  Short Term Goals: Ability to remain free from injury will improve, Ability to verbalize frustration and anger appropriately will improve, Ability to identify and develop effective coping behaviors will improve and Compliance with prescribed medications will improve  Medication Management: RN will administer medications as ordered by provider, will assess and evaluate patient's  response and provide education to patient for prescribed medication. RN will report any adverse and/or side effects to prescribing provider.  Therapeutic Interventions: 1 on 1 counseling sessions, Psychoeducation, Medication administration, Evaluate responses to treatment, Monitor vital signs and CBGs as ordered, Perform/monitor CIWA, COWS, AIMS and Fall Risk screenings as ordered, Perform wound care treatments as ordered.  Evaluation of Outcomes: Not Met   LCSW Treatment Plan for Primary Diagnosis: <principal problem not specified> Long Term Goal(s): Safe transition to appropriate next level of care at discharge, Engage patient in therapeutic group addressing interpersonal concerns.  Short Term Goals: Engage patient in aftercare planning with referrals and resources, Increase social support, Increase ability to appropriately verbalize feelings, Identify triggers associated with mental health/substance abuse issues and Increase skills for wellness and recovery  Therapeutic Interventions: Assess for all discharge needs, 1 to 1 time with Social worker, Explore available resources and support systems, Assess for adequacy in community support network, Educate family and significant other(s) on suicide prevention, Complete Psychosocial Assessment, Interpersonal group therapy.  Evaluation of Outcomes: Not Met   Progress in Treatment: Attending groups: No. Participating in groups: No. Taking medication as prescribed: Yes. Toleration medication: Yes. Family/Significant other contact made: No, will contact:  if consent is provided Patient understands diagnosis: No. Discussing patient identified problems/goals with staff: Yes. Medical problems stabilized or resolved: Yes. Denies suicidal/homicidal ideation: Yes. Issues/concerns per patient self-inventory: No.   New problem(s) identified: No, Describe:  none  New Short Term/Long Term Goal(s): detox, medication management for mood stabilization;  elimination of SI thoughts; development of comprehensive mental wellness/sobriety plan  Patient Goals: "To get rest and go home"   Discharge Plan or Barriers: Patient recently admitted. CSW will continue to follow and assess for appropriate referrals and possible discharge planning.    Reason for Continuation of Hospitalization: Homicidal ideation Medication stabilization Suicidal ideation  Estimated Length of Stay: 3-5 days  Attendees: Patient: Damon Olson 05/06/2021 11:07 AM  Physician: Ethelene Browns, MD 05/06/2021 11:07 AM  Nursing:  05/06/2021 11:07 AM  RN Care Manager: 05/06/2021 11:07 AM  Social Worker: Darletta Moll, LCSW 05/06/2021 11:07 AM  Recreational Therapist:  05/06/2021 11:07 AM  Other:  05/06/2021 11:07 AM  Other:  05/06/2021 11:07 AM  Other: 05/06/2021 11:07 AM    Scribe for Treatment Team: Vassie Moselle, LCSW 05/06/2021 11:07 AM

## 2021-05-06 NOTE — Plan of Care (Signed)
Nurse discussed coping skills with patient.  

## 2021-05-06 NOTE — Progress Notes (Deleted)
Recreation Therapy Notes  Date:  5.23.22 Time: 0930 Location: [address] Dayroom  Group Topic: Stress Management  Goal Area(s) Addresses:  Patient will identify positive stress management techniques. Patient will identify benefits of using stress management post d/c.  Intervention: Stress Management  Activity:  Meditation.  LRT played a meditation that focused on calming anxiety.  Patients were to listen and follow along as meditation played to fully engage in activity.  Education:  Stress Management, Discharge Planning.   Education Outcome: Acknowledges Education  Clinical Observations/Feedback: Pt did not attend group session.   Caroll Rancher, LRT/CTRS         Lillia Abed, Maribel Luis A 05/06/2021 12:12 PM

## 2021-05-06 NOTE — H&P (Signed)
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  Patient's case discussed in detail with members of the treatment team.  I met with and evaluated the patient on the unit today. Damon Olson is a 45 year old male with a history of anxiety and panic attacks well as alcohol and cannabis use who was admitted on IVC taken out by his wife for reportedly threatening to kill everyone in his house and threatening to burn the house down.  The patient was transported to [facility] ED by [facility].  ED notes document that patient had a loaded rifle and barricaded himself inside the house.  Chart notes indicate that police reported being called to the home for a domestic dispute and were told that the patient had a large knife and told a live-in roommate that he was going to kill himself and everyone else in the house.  Reportedly the patient was cooperative with police.  On arrival to the ED, patient denied SI, HI or other symptoms.  He adamantly denied that he had barricaded himself inside the house or made threats to harm himself or anyone else.  Collateral information obtained in the ED from patient's ex-partner indicated that she ended their relationship 2 weeks ago due to patient's refusal to get help for his drinking problem.  She also reported that patient had made paranoid statements about the roommate as well as about his brothers.  In the ED, BAL was 82 mg/dL and urine drug screen was positive for THC.  The patient was  transferred to [facility] for further assessment and treatment of his psychiatric symptoms and for safety of patient and others.    During my interview, the patient adamantly denies any desire to harm others and denies making any threats to harm others.  He does acknowledge that during a conversation with the roommate he stated that "if anything happened to my girls I am going to burn this down."  Patient explains he was merely talking and saying that he would do anything to protect his daughters.  He denies that he barricaded himself in a room or in the home.  He reports he typically keeps the front door locked and it was locked when police arrived but he opened it for them.  He denies that he had barricaded himself in a room with guns.  He does acknowledge that he owns multiple guns which he states are for protection.  Patient states that he himself called the police because he has wanted the roommate (whom patient describes as "a felon") to move out for the last month.  Patient reports that the day of the police call had otherwise been a normal day and that there was no dispute in the house prior to the police arriving.  He denies conflict with his partner, roommate, neighbors or anyone else.  He was barbecuing that afternoon and had a few drinks prior to the police arriving.  The patient describes his mood as "good" as it always is.  He reports that he always has high energy.  Patient states that he was sleeping  well (from 8 to 10 PM until 6 AM) during the weeks prior to admission.  He denies sad or depressed mood or anhedonia.  He denies racing thoughts or mood swings.  He denies trouble concentrating.  Patient denies passive wish for death, SI, AI, HI, PI.  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  The patient states that he has a history of anxiety for which she has  been prescribed Celexa and Xanax.  He has taken Celexa on and off for the last 5 to 7 years.  The patient experiences panic attacks consist of feeling very anxious like something bad will happen, numbness of extremities, racing heart, tingling, hyperventilation, feeling hot and sweating.  Attacks tend to last from 10 minutes to 3 days and occur approximately 3-4 times per week without identifiable precipitant.  The patient reports that he drinks socially and estimates 3-4 drinks per occasion approximately once per week.  In the ED he reported alcohol use approximately 3 times per week of 1.5 shots per occasion.  Collateral information obtained in the ED from patient's ex partner indicates that patient drinks daily.  Patient reports marijuana use of 1-2 joints several times a week to reduce his anxiety.  The patient denies other drug use.  He smokes cigarettes one half a pack per day.  Associated Signs/Symptoms: Depression Symptoms:  Patient denies.  IVC alleges that patient made threats to harm himself and others Duration of Depression Symptoms: Greater than two weeks  (Hypo) Manic Symptoms:  Impulsivity, Irritability Anxiety Symptoms:  Panic Symptoms, Psychotic Symptoms:  Denies.  Collateral information from ex partner indicates patient has made paranoid statements about roommate and siblings PTSD Symptoms: NA Total Time spent with patient: 50 minutes  Past Psychiatric History: The patient reports prior psychiatric treatment by primary care with medications for anxiety over the last 15 years and has been most recently treated with Celexa and Xanax.  He reports taking infrequent Xanax for panic attacks with last use of Xanax last month.  He takes Celexa 20 mg tablet approximately 3-4 times per week.  The patient denies any history of psychiatric inpatient hospitalizations.  The patient denies any history of suicide attempts or nonsuicidal self-injurious behavior.  He denies ever seeing outpatient  psychiatrist or therapist.  Is the patient at risk to self? Yes.    Has the patient been a risk to self in the past 6 months? Yes.    Has the patient been a risk to self within the distant past? No.  Is the patient a risk to others? Yes.    Has the patient been a risk to others in the past 6 months?  Not known Has the patient been a risk to others within the distant past?  Not known.   Prior Inpatient Therapy:   Prior Outpatient Therapy:    Alcohol Screening: 1. How often do you have a drink containing alcohol?: 4 or more times a week 2. How many drinks containing alcohol do you have on a typical day when you are drinking?: 3 or 4 3. How often do you have six or more drinks on one occasion?: Never AUDIT-C Score: 5 4. How often during the last year have you found that you were not able to stop drinking once you had started?: Weekly 5. How often during the last year have you failed to do what  was normally expected from you because of drinking?: Less than monthly 6. How often during the last year have you needed a first drink in the morning to get yourself going after a heavy drinking session?: Never 7. How often during the last year have you had a feeling of guilt of remorse after drinking?: Never 8. How often during the last year have you been unable to remember what happened the night before because you had been drinking?: Never 9. Have you or someone else been injured as a result of your drinking?: No 10. Has a relative or friend or a doctor or another health worker been concerned about your drinking or suggested you cut down?: No Alcohol Use Disorder Identification Test Final Score (AUDIT): 9 Alcohol Brief Interventions/Follow-up: Alcohol education/Brief advice Substance Abuse History in the last 12 months:  Yes.   The patient reports that he drinks socially and estimates 3-4 drinks per occasion approximately once per week.  In the ED he reported alcohol use approximately 3 times per week  of 1.5 shots per occasion.  Collateral information obtained in the ED from patient's ex partner indicates that patient drinks daily.  Patient reports marijuana use of 1-2 joints several times a week to reduce his anxiety.  The patient denies other drug use.  He smokes cigarettes one half a pack per day. Consequences of Substance Abuse: Alcohol and cannabis use of likely exacerbated patient's mood symptoms, anxiety and contributed to reported statements of wanting to harm self or others prior to admission Previous Psychotropic Medications: Yes  Psychological Evaluations: Unclear; patient states he is only seen primary care provider in the past Past Medical History:  Past Medical History:  Diagnosis Date  . Anxiety   . Depression   . Marijuana use 05/06/2021  . 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 Panic disorder 05/06/2021  . Unspecified mood (affective) disorder ([place]) 05/06/2021   History reviewed. No pertinent surgical history. Family History: History reviewed. No pertinent family history. Family Psychiatric  History: The patient denies any known family history of psychiatric issues, completed suicide or alcohol or substance use issues. Tobacco Screening: Have you used any form of tobacco in the last 30 days? (Cigarettes, Smokeless Tobacco, Cigars, and/or Pipes): Yes Tobacco use, Select all that apply: 5 or more cigarettes per day Are you interested in Tobacco Cessation Medications?: Yes, will notify MD for an order Counseled patient on smoking cessation including recognizing danger situations, developing coping skills and basic information about quitting provided: Yes Social History:  Social History   Substance and Sexual Activity  Alcohol Use Yes   Comment: 3-4 glasses wine/beer every other day     Social History   Substance and Sexual Activity  Drug Use No    Additional Social History:      Pain Medications: see MAR Prescriptions: see MAR Over the Counter: SEE  MAR History of alcohol / drug use?: Yes Longest period of sobriety (when/how long): unsure Negative Consequences of Use: Financial,Personal relationships,Work / School Withdrawal Symptoms: Other (Comment) (denied withdrawals) Name of Substance 1: alcohol 1 - Age of First Use: 45 yrs old 1 - Amount (size/oz): 3-4 drinks every other day 1 - Frequency: every other day 1 - Duration: 20 plus yrs 1 - Last Use / Amount: 2 days ago 1 - Method of Aquiring: purchases 1- Route of Use: p.o.                  Allergies:  No Known Allergies Lab Results:  Results for orders placed or performed during the hospital encounter of 05/05/21 (from the past 48 hour(s))  Comprehensive metabolic panel     Status: Abnormal   Collection Time: 05/05/21  8:22 PM  Result Value Ref Range   Sodium 142 135 - 145 mmol/L   Potassium 3.3 (L) 3.5 - 5.1 mmol/L   Chloride 109 98 - 111 mmol/L   CO2 22 22 - 32 mmol/L   Glucose, Bld 110 (H) 70 - 99 mg/dL    Comment: Glucose reference range applies only to samples taken after fasting for at least 8 hours.   BUN 9 6 - 20 mg/dL   Creatinine, Ser 0.66 0.61 - 1.24 mg/dL   Calcium 8.6 (L) 8.9 - 10.3 mg/dL   Total Protein 6.4 (L) 6.5 - 8.1 g/dL   Albumin 4.0 3.5 - 5.0 g/dL   AST 35 15 - 41 U/L   ALT 27 0 - 44 U/L   Alkaline Phosphatase 57 38 - 126 U/L   Total Bilirubin 1.0 0.3 - 1.2 mg/dL   GFR, Estimated >60 >60 mL/min    Comment: (NOTE) Calculated using the CKD-EPI Creatinine Equation (2021)    Anion gap 11 5 - 15    Comment: Performed at [facility], [place] [address] [place], [place] [address]  Ethanol     Status: Abnormal   Collection Time: 05/05/21  8:22 PM  Result Value Ref Range   Alcohol, Ethyl (B) 82 (H) <10 mg/dL    Comment: (NOTE) Lowest detectable limit for serum alcohol is 10 mg/dL.  For medical purposes only. Performed at [facility], [place] [address], [place], [place] [address]   Salicylate level     Status: Abnormal   Collection  Time: 05/05/21  8:22 PM  Result Value Ref Range   Salicylate Lvl <5.8 (L) 7.0 - 30.0 mg/dL    Comment: Performed at [facility] [address] [place], [place] [address]  Acetaminophen level     Status: Abnormal   Collection Time: 05/05/21  8:22 PM  Result Value Ref Range   Acetaminophen (Tylenol), Serum <10 (L) 10 - 30 ug/mL    Comment: (NOTE) Therapeutic concentrations vary significantly. A range of 10-30 ug/mL  may be an effective concentration for many patients. However, some  are best treated at concentrations outside of this range. Acetaminophen concentrations >150 ug/mL at 4 hours after ingestion  and >50 ug/mL at 12 hours after ingestion are often associated with  toxic reactions.  Performed at [facility], [place] [address], [place], [place] [address]   cbc     Status: Abnormal   Collection Time: 05/05/21  8:22 PM  Result Value Ref Range   WBC 4.3 4.0 - 10.5 K/uL   RBC 4.11 (L) 4.22 - 5.81 MIL/uL   Hemoglobin 13.6 13.0 - 17.0 g/dL   HCT 40.8 39.0 - 52.0 %   MCV 99.3 80.0 - 100.0 fL   MCH 33.1 26.0 - 34.0 pg   MCHC 33.3 30.0 - 36.0 g/dL   RDW 13.2 11.5 - 15.5 %   Platelets 265 150 - 400 K/uL   nRBC 0.0 0.0 - 0.2 %    Comment: Performed at [facility], [place] [address], [place], [place] [address]  Lipid panel     Status: Abnormal   Collection Time: 05/05/21  8:22 PM  Result Value Ref Range   Cholesterol 176 0 - 200 mg/dL   Triglycerides 582 (H) <  150 mg/dL   HDL 62 >40 mg/dL   Total CHOL/HDL Ratio 2.8 RATIO   VLDL UNABLE TO CALCULATE IF TRIGLYCERIDE OVER 400 mg/dL 0 - 40 mg/dL   LDL Cholesterol UNABLE TO CALCULATE IF TRIGLYCERIDE OVER 400 mg/dL 0 - 99 mg/dL    Comment:        Total Cholesterol/HDL:CHD Risk Coronary Heart Disease Risk Table                     Men   Women  1/2 Average Risk   3.4   3.3  Average Risk       5.0   4.4  2 X Average Risk   9.6   7.1  3 X Average Risk  23.4   11.0        Use the calculated Patient Ratio above and  the CHD Risk Table to determine the patient's CHD Risk.        ATP III CLASSIFICATION (LDL):  <100     mg/dL   Optimal  100-129  mg/dL   Near or Above                    Optimal  130-159  mg/dL   Borderline  160-189  mg/dL   High  >190     mg/dL   Very High Performed at [facility] [address] [place], [place] [address]   Hemoglobin A1c     Status: None   Collection Time: 05/05/21  8:22 PM  Result Value Ref Range   Hgb A1c MFr Bld 5.4 4.8 - 5.6 %    Comment: (NOTE) Pre diabetes:          5.7%-6.4%  Diabetes:              >6.4%  Glycemic control for   <7.0% adults with diabetes    Mean Plasma Glucose 108.28 mg/dL    Comment: Performed at [facility] [address], [place], [place] [address]  LDL cholesterol, direct     Status: None   Collection Time: 05/05/21  8:22 PM  Result Value Ref Range   Direct LDL 69.9 0 - 99 mg/dL    Comment: Performed at [facility] [address], [place], [place] [address]  Resp Panel by RT-PCR (Flu A&B, Covid) Nasopharyngeal Swab     Status: None   Collection Time: 05/05/21  8:32 PM   Specimen: Nasopharyngeal Swab; Nasopharyngeal(NP) swabs in vial transport medium  Result Value Ref Range   SARS Coronavirus 2 by RT PCR NEGATIVE NEGATIVE    Comment: (NOTE) SARS-CoV-2 target nucleic acids are NOT DETECTED.  The SARS-CoV-2 RNA is generally detectable in upper respiratory specimens during the acute phase of infection. The lowest concentration of SARS-CoV-2 viral copies this assay can detect is 138 copies/mL. A negative result does not preclude SARS-Cov-2 infection and should not be used as the sole basis for treatment or other patient management decisions. A negative result may occur with  improper specimen collection/handling, submission of specimen other than nasopharyngeal swab, presence of viral mutation(s) within the areas targeted by this assay, and inadequate number of viral copies(<138 copies/mL). A negative  result must be combined with clinical observations, patient history, and epidemiological information. The expected result is Negative.  Fact Sheet for Patients:  EntrepreneurPulse.com.au  Fact Sheet for Healthcare Providers:  IncredibleEmployment.be  This test is no t yet approved or cleared by the [place] and  has been authorized for detection and/or diagnosis of SARS-CoV-2 by FDA under an Emergency Use Authorization (EUA). This EUA will remain  in effect (meaning this test can be used) for the duration of the COVID-19 declaration under Section 564(b)(1) of the Act, 21 U.S.C.section 360bbb-3(b)(1), unless the authorization is terminated  or revoked sooner.       Influenza A by PCR NEGATIVE NEGATIVE   Influenza B by PCR NEGATIVE NEGATIVE    Comment: (NOTE) The Xpert Xpress SARS-CoV-2/FLU/RSV plus assay is intended as an aid in the diagnosis of influenza from Nasopharyngeal swab specimens and should not be used as a sole basis for treatment. Nasal washings and aspirates are unacceptable for Xpert Xpress SARS-CoV-2/FLU/RSV testing.  Fact Sheet for Patients: EntrepreneurPulse.com.au  Fact Sheet for Healthcare Providers: IncredibleEmployment.be  This test is not yet approved or cleared by the [place] FDA and has been authorized for detection and/or diagnosis of SARS-CoV-2 by FDA under an Emergency Use Authorization (EUA). This EUA will remain in effect (meaning this test can be used) for the duration of the COVID-19 declaration under Section 564(b)(1) of the Act, 21 U.S.C. section 360bbb-3(b)(1), unless the authorization is terminated or revoked.  Performed at [facility], [place] [address], [place], [place] [address]   Rapid urine drug screen (hospital performed)     Status: Abnormal   Collection Time: 05/05/21  8:41 PM  Result Value Ref Range   Opiates NONE DETECTED NONE DETECTED    Cocaine NONE DETECTED NONE DETECTED   Benzodiazepines NONE DETECTED NONE DETECTED   Amphetamines NONE DETECTED NONE DETECTED   Tetrahydrocannabinol POSITIVE (A) NONE DETECTED   Barbiturates NONE DETECTED NONE DETECTED    Comment: (NOTE) DRUG SCREEN FOR MEDICAL PURPOSES ONLY.  IF CONFIRMATION IS NEEDED FOR ANY PURPOSE, NOTIFY LAB WITHIN 5 DAYS.  LOWEST DETECTABLE LIMITS FOR URINE DRUG SCREEN Drug Class                     Cutoff (ng/mL) Amphetamine and metabolites    1000 Barbiturate and metabolites    200 Benzodiazepine                 191 Tricyclics and metabolites     300 Opiates and metabolites        300 Cocaine and metabolites        300 THC                            50 Performed at [facility], [place] [address], [place], [place] [address]     Blood Alcohol level:  Lab Results  Component Value Date   ETH 82 (H) 56/21/3086    Metabolic Disorder Labs:  Lab Results  Component Value Date   HGBA1C 5.4 05/05/2021   MPG 108.28 05/05/2021   No results found for: PROLACTIN Lab Results  Component Value Date   CHOL 176 05/05/2021   TRIG 582 (H) 05/05/2021   HDL 62 05/05/2021   CHOLHDL 2.8 05/05/2021   VLDL UNABLE TO CALCULATE IF TRIGLYCERIDE OVER 400 mg/dL 05/05/2021   LDLCALC UNABLE TO CALCULATE IF TRIGLYCERIDE OVER 400 mg/dL 05/05/2021    Current Medications: Current Facility-Administered Medications  Medication Dose Route Frequency Provider Last Rate Last Admin  . acetaminophen (TYLENOL) tablet 650 mg  650 mg Oral Q6H PRN Prescilla Sours, PA-C      . alum & mag hydroxide-simeth (MAALOX/MYLANTA) 200-200-20 MG/5ML suspension 30 mL  30 mL Oral Q4H PRN Prescilla Sours, PA-C      . hydrOXYzine (ATARAX/VISTARIL) tablet 25 mg  25 mg Oral TID PRN Prescilla Sours, PA-C      . loperamide (IMODIUM) capsule 2-4 mg  2-4 mg Oral PRN Sharma Covert, MD      . Derrill Memo ON 05/09/2021] LORazepam (ATIVAN) tablet 0.5 mg  0.5 mg Oral Q12H Arthor Captain, MD       Followed by   . LORazepam (ATIVAN) tablet 1 mg  1 mg Oral Q8H Arthor Captain, MD   1 mg at 05/06/21 1648   Followed by  . [START ON 05/07/2021] LORazepam (ATIVAN) tablet 1 mg  1 mg Oral Q12H Arthor Captain, MD      . LORazepam (ATIVAN) tablet 1 mg  1 mg Oral Q6H PRN Sharma Covert, MD      . OLANZapine zydis (ZYPREXA) disintegrating tablet 5 mg  5 mg Oral Q8H PRN Sharma Covert, MD       And  . LORazepam (ATIVAN) tablet 1 mg  1 mg Oral Q6H PRN Sharma Covert, MD       And  . ziprasidone (GEODON) injection 20 mg  20 mg Intramuscular Q6H PRN Sharma Covert, MD      . magnesium hydroxide (MILK OF MAGNESIA) suspension 30 mL  30 mL Oral Daily PRN Margorie John W, PA-C      . multivitamin with minerals tablet 1 tablet  1 tablet Oral Daily Sharma Covert, MD   1 tablet at 05/06/21 0900  . OLANZapine (ZYPREXA) tablet 7.5 mg  7.5 mg Oral QHS Arthor Captain, MD      . ondansetron (ZOFRAN-ODT) disintegrating tablet 4 mg  4 mg Oral Q6H PRN Sharma Covert, MD      . thiamine tablet 100 mg  100 mg Oral Daily Sharma Covert, MD   100 mg at 05/06/21 0900  . traZODone (DESYREL) tablet 50 mg  50 mg Oral QHS PRN Prescilla Sours, PA-C       PTA Medications: Medications Prior to Admission  Medication Sig Dispense Refill Last Dose  . ALPRAZolam (XANAX) 0.5 MG tablet Take 0.5 mg by mouth 2 (two) times daily as needed for anxiety.   Past Month  . ascorbic acid (VITAMIN C) 100 MG tablet Take 100 mg by mouth daily.   05/05/2021  . citalopram (CELEXA) 20 MG tablet Take 20 mg by mouth daily.   05/06/2021  . Ergocalciferol (VITAMIN D2 PO) Take 1 tablet by mouth daily.   05/05/2021  . MILK THISTLE PO Take 1 capsule by mouth daily.   05/05/2021  . Multiple Vitamin (MULTIVITAMIN WITH MINERALS) TABS tablet Take 1 tablet by mouth daily.   05/05/2021  . Omega-3 1000 MG CAPS Take 1,000 mg by mouth daily.   05/05/2021    Musculoskeletal: Strength & Muscle Tone: within normal limits Gait & Station: normal Patient  leans: N/A            Psychiatric Specialty Exam:  Presentation  General Appearance: Appropriate for Environment; Casual  Eye Contact:Good  Speech:Clear and Coherent; Normal Rate  Speech Volume:Normal  Handedness:No data recorded  Mood and Affect  Mood:Anxious  Affect:Full Range   Thought Process  Thought Processes:Coherent; Goal Directed  Duration of Psychotic Symptoms: Less than six months  Past Diagnosis of Schizophrenia or Psychoactive disorder: No  Descriptions of Associations:Intact  Orientation:Full (Time, Place and Person)  Thought Content:Logical  Hallucinations:Hallucinations: None  Ideas of Reference:None  Suicidal Thoughts:Suicidal Thoughts: No  Homicidal Thoughts:Homicidal Thoughts: No   Sensorium  Memory:Recent Poor; Immediate Good; Remote Good  Judgment:Fair  Insight:Lacking   Executive Functions  Concentration:Good  Attention Span:Good  Recall:Fair; Other (comment) (Poor memory for events precipitating admission)  Fund of Knowledge:Good  Language:Good   Psychomotor Activity  Psychomotor Activity:Psychomotor Activity: Normal   Assets  Assets:Housing; Resilience; Physical Health; Social Support   Sleep  Sleep:Sleep: Poor (in ED)    Physical Exam: Physical Exam Vitals and nursing note reviewed.  HENT:     Head: Normocephalic and atraumatic.  Pulmonary:     Effort: Pulmonary effort is normal.  Neurological:     General: No focal deficit present.     Mental Status: He is alert and oriented to person, place, and time.    ROS  Respiratory: Negative.   Cardiovascular: Negative.   Gastrointestinal: Negative.   Psychiatric/Behavioral: Positive for substance abuse. Negative for depression, hallucinations and suicidal ideas. The patient is nervous/anxious. The patient does not have insomnia.    Blood pressure (!) 136/97, pulse 67, temperature 98.1 F (36.7 C), temperature source Oral, resp. rate 18, height 6'  1" (1.854 m), weight 59 kg, SpO2 100 %. Body mass index is 17.15 kg/m.  Treatment Plan Summary: Daily contact with patient to assess and evaluate symptoms and progress in treatment and Medication management  Continue IVC status.  Second QPE completed by this Probation officer today.  Observation Level/Precautions:  15 minute checks  Laboratory:  CBC Chemistry Profile HbAIC UDS TSH, free T4, lipid panel. Available labs reviewed.  CMP reveals potassium of 3.3, glucose of 110, calcium of 8.6, total protein of 6.4 and otherwise WNL.  Lipid profile reveals triglycerides of 582 and otherwise WNL.  CBC reveals RBC of 4.11 and otherwise WNL.  Hemoglobin A1c was 5.4.  Influenza A, influenza B and corona virus tests were all negative.  BAL was 82.  Salicylate level was <3.7.  Acetaminophen level was <10.  Urine drug screen was positive for THC only.  TSH and free T4 have been ordered but not yet drawn.    EKG revealed normal sinus rhythm, incomplete right bundle branch block, anteroseptal infarct age undetermined.  Ventricular rate of 61 with QT/QTc of 414/416.    Psychotherapy: Encourage participation in group therapy and therapeutic milieu  Medications: See MAR  Consultations:    Discharge Concerns: Report of multiple firearms in the home.  Firearms need to be secured.  Safety planning needs to be performed.  Patient will need referral to outpatient psychiatry and psychotherapy at discharge as well as substance use disorder treatment program.  Estimated LOS: 5 to 7 days  Other:     Physician Treatment Plan for Primary Diagnosis: Unspecified mood (affective) disorder ([place]) Long Term Goal(s): Improvement in symptoms so as ready for discharge  Short Term Goals: Ability to identify changes in lifestyle to reduce recurrence of condition will improve, Ability to verbalize feelings will improve, Ability to disclose and discuss suicidal ideas, Ability to demonstrate self-control will improve, Ability to identify and  develop effective coping behaviors will improve, Compliance with prescribed medications will improve and Ability to identify triggers associated with substance abuse/mental health issues will improve  Physician Treatment Plan for Secondary Diagnosis: Principal Problem:   Unspecified mood (affective) disorder ([place]) Active Problems:   Substance induced mood disorder (HCC)   Panic disorder   Marijuana use   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[place])  Long Term Goal(s): Improvement in  symptoms so as ready for discharge  Short Term Goals: Ability to identify changes in lifestyle to reduce recurrence of condition will improve, Ability to verbalize feelings will improve, Ability to disclose and discuss suicidal ideas, Ability to demonstrate self-control will improve, Ability to identify and develop effective coping behaviors will improve, Compliance with prescribed medications will improve and Ability to identify triggers associated with substance abuse/mental health issues will improve  I certify that inpatient services furnished can reasonably be expected to improve the patient's condition.    Arthor Captain, MD 5/23/20225:13 PM
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
## 2021-05-06 NOTE — ED Notes (Signed)
Pt transferred to [facility] by [employer]. Report called to Okay.

## 2021-05-06 NOTE — [facility] Group Notes (Signed)
Patient did not attend group.  ADULT GRIEF GROUP NOTE:   Spiritual care group on grief and loss facilitated by chaplain Katy Shavette Shoaff, [facility]   Group Goal:   Support / Education around grief and loss   Members engage in facilitated group support and psycho-social education.   Group Description:   Following introductions and group rules, group members engaged in facilitated group dialog and support around topic of loss, with particular support around experiences of loss in their lives. Group Identified types of loss (relationships / self / things) and identified patterns, circumstances, and changes that precipitate losses. Reflected on thoughts / feelings around loss, normalized grief responses, and recognized variety in grief experience. Group noted Worden's four tasks of grief in discussion.   Group drew on Adlerian / Rogerian, narrative, MI,   Patient Progress:   

## 2021-05-06 NOTE — BHH Suicide Risk Assessment (Signed)
[facility] Admission Suicide Risk Assessment   Nursing information obtained from:  Patient Demographic factors:  Male,Low socioeconomic status Current Mental Status:  NA Loss Factors:  Financial problems / change in socioeconomic status Historical Factors:  NA Risk Reduction Factors:  Responsible for children under 43 years of age,Sense of responsibility to family,Living with another person, especially a relative  Total Time spent with patient: 50 minutes Principal Problem: Unspecified mood (affective) disorder ([place]) Diagnosis:  Principal Problem:   Unspecified mood (affective) disorder ([place]) Active Problems:   Substance induced mood disorder ([place])   Panic disorder   Marijuana use   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[place])  Subjective Data: Medical record reviewed.  Patient's case discussed in detail with members of the treatment team.  I met with and evaluated the patient on the unit today. Damon Olson is a 45 year old male with a history of anxiety and panic attacks well as alcohol and cannabis use who was admitted on IVC taken out by his wife for reportedly threatening to kill everyone in his house and threatening to burn the house down.  The patient was transported to [facility] ED by [facility].  ED notes document that patient had a loaded rifle and barricaded himself inside the house.  Chart notes indicate that police reported being called to the home for a domestic dispute and were told that the patient had a large knife and told a live-in roommate that he was going to kill himself and everyone else in the house.  Reportedly the patient was cooperative with police.  On arrival to the ED, patient denied SI, HI or other symptoms.  He adamantly denied that he had barricaded himself inside the house or made threats to harm himself or anyone else.  Collateral information obtained in the ED from patient's ex-partner indicated that she ended their relationship 2 weeks ago due to  patient's refusal to get help for his drinking problem.  She also reported that patient had made paranoid statements about the roommate as well as about his brothers.  In the ED, BAL was 82 mg/dL and urine drug screen was positive for THC.  The patient was transferred to [facility] for further assessment and treatment of his psychiatric symptoms and for safety of patient and others.    During my interview, the patient adamantly denies any desire to harm others and denies making any threats to harm others.  He does acknowledge that during a conversation with the roommate he stated that "if anything happened to my girls I am going to burn this down."  Patient explains he was merely talking and saying that he would do anything to protect his daughters.  He denies that he barricaded himself in a room or in the home.  He reports he typically keeps the front door locked and it was locked when police arrived but he opened it for them.  He denies that he had barricaded himself in a room with guns.  He does acknowledge that he owns multiple guns which he states are for protection.  Patient states that he himself called the police because he has wanted the roommate (whom patient describes as "a felon") to move out for the last month.  Patient reports that the day of the police call had otherwise been a normal day and that there was no dispute in the house prior to the police arriving.  He denies conflict with his partner, roommate, neighbors or anyone else.  He was barbecuing that  afternoon and had a few drinks prior to the police arriving.  The patient describes his mood as "good" as it always is.  He reports that he always has high energy.  Patient states that he was sleeping well (from 8 to 10 PM until 6 AM) during the weeks prior to admission.  He denies sad or depressed mood or anhedonia.  He denies racing thoughts or mood swings.  He denies trouble concentrating.  Patient denies passive wish for death, SI, AI, HI, PI.  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  The patient states that he has a history of anxiety for which she has been prescribed Celexa and Xanax.  He has taken Celexa on and off for the last 5 to 7 years.  The patient experiences panic attacks consist of feeling very anxious like something bad will happen, numbness of extremities, racing heart, tingling, hyperventilation, feeling hot and sweating.  Attacks tend to last from 10 minutes to 3 days and occur approximately 3-4 times per week without identifiable precipitant.  The patient reports that he drinks socially and estimates 3-4 drinks per occasion approximately once per week.  In the ED he reported alcohol use approximately 3 times per week of 1.5 shots per occasion.  Collateral information obtained in the ED from patient's ex partner indicates that patient drinks daily.  He reports marijuana use of 1-2 joints several times a week to reduce his anxiety.  The patient denies other drug use.  He smokes cigarettes one half a pack per day.  The patient reports prior psychiatric treatment by primary care with medications for anxiety over the last 15 years and has been most recently treated with Celexa and Xanax.  He reports taking infrequent Xanax for panic attacks with last use of Xanax last month.  He takes Celexa 20 mg tablet approximately 3-4 times per week.  The patient denies any history of psychiatric inpatient hospitalizations.  The patient denies any history of suicide attempts or nonsuicidal self-injurious behavior.  He denies ever seeing outpatient psychiatrist or therapist.  The patient denies any known family history of psychiatric issues, completed suicide or alcohol or substance use issues.  Continued Clinical Symptoms:  Alcohol Use Disorder Identification Test Final Score (AUDIT): 9 The "Alcohol Use  Disorders Identification Test", Guidelines for Use in Primary Care, Second Edition.  World Pharmacologist [facility]). Score between 0-7:  no or low risk or alcohol related problems. Score between 8-15:  moderate risk of alcohol related problems. Score between 16-19:  high risk of alcohol related problems. Score 20 or above:  warrants further diagnostic evaluation for alcohol dependence and treatment.   CLINICAL FACTORS:   Severe Anxiety and/or Agitation Panic Attacks Alcohol/Substance Abuse/Dependencies Previous Psychiatric Diagnoses and Treatments   Musculoskeletal: Strength & Muscle Tone: within normal limits Gait & Station: normal Patient leans: N/A  Psychiatric Specialty Exam:  Presentation  General Appearance: Appropriate for Environment; Casual  Eye Contact:Good  Speech:Clear and Coherent; Normal Rate  Speech Volume:Normal  Handedness:No data recorded  Mood and Affect  Mood:Anxious  Affect:Full Range   Thought Process  Thought Processes:Coherent; Goal Directed  Descriptions of Associations:Intact  Orientation:Full (Time, Place and Person)  Thought Content:Logical  History of Schizophrenia/Schizoaffective disorder:No  Duration of Psychotic Symptoms:Less than six months  Hallucinations:Hallucinations: None  Ideas of Reference:None  Suicidal Thoughts:Suicidal Thoughts: No  Homicidal Thoughts:Homicidal Thoughts:  No   Sensorium  Memory:Recent Poor; Immediate Good; Remote Good  Judgment:Fair  Insight:Lacking   Executive Functions  Concentration:Good  Attention Span:Good  Recall:Fair; Other (comment) (Poor memory for events precipitating admission)  Fund of Knowledge:Good  Language:Good   Psychomotor Activity  Psychomotor Activity:Psychomotor Activity: Normal   Assets  Assets:Housing; Resilience; Physical Health; Social Support   Sleep  Sleep:Sleep: Poor (in ED)    Physical Exam: Physical Exam Vitals and nursing note  reviewed.  HENT:     Head: Normocephalic and atraumatic.  Pulmonary:     Effort: Pulmonary effort is normal.  Neurological:     General: No focal deficit present.     Mental Status: He is alert and oriented to person, place, and time.    Review of Systems  Respiratory: Negative.   Cardiovascular: Negative.   Gastrointestinal: Negative.   Psychiatric/Behavioral: Positive for substance abuse. Negative for depression, hallucinations and suicidal ideas. The patient is nervous/anxious. The patient does not have insomnia.    Blood pressure (!) 123/111, pulse 72, temperature 97.6 F (36.4 C), temperature source Oral, resp. rate 20, height $RemoveBe'6\' 1"'ZHWCZEUhC$  (1.854 m), weight 59 kg, SpO2 100 %. Body mass index is 17.15 kg/m.   COGNITIVE FEATURES THAT CONTRIBUTE TO RISK:  None    SUICIDE RISK:   Moderate:  Frequent suicidal ideation with limited intensity, and duration, some specificity in terms of plans, no associated intent, good self-control, limited dysphoria/symptomatology, some risk factors present, and identifiable protective factors, including available and accessible social support.  PLAN OF CARE: The patient has been admitted to the 300 unit.  Continue every 15-minute observation status.  Encouraged participation in group therapy and therapeutic milieu.  Available labs reviewed.  CMP reveals potassium of 3.3, glucose of 110, calcium of 8.6, total protein of 6.4 and otherwise WNL.  Lipid profile reveals triglycerides of 582 and otherwise WNL.  CBC reveals RBC of 4.11 and otherwise WNL.  Hemoglobin A1c was 5.4.  Influenza A, influenza B and corona virus tests were all negative.  BAL was 82.  Salicylate level was <4.6.  Acetaminophen level was <10.  Urine drug screen was positive for THC only.  TSH and free T4 have been ordered but not yet drawn.  EKG revealed normal sinus rhythm, incomplete right bundle branch block, anteroseptal infarct age undetermined.  Ventricular rate of 61 with QT/QTc of 414/416.   Patient has been started on CIWA protocol with standing dose lorazepam taper and PRN lorazepam for CIWA scores greater than 10.  See MAR for additional medications.  Estimated length of stay 5 to 7 days.  I certify that inpatient services furnished can reasonably be expected to improve the patient's condition.   Arthor Captain, MD 05/06/2021, 3:47 PM

## 2021-05-06 NOTE — BH Assessment (Signed)
Pt's IVC states:   "Respondent suffers from anxiety and has been prescribed Celexa. Today he stated he would kill everyone, including himself, the children, and the petitioner. Respondent threatened to burn down the house. He abuses alcohol and smokes marijuana. Respondent also was mixing Xanax with alcohol."

## 2021-05-06 NOTE — ED Provider Notes (Signed)
[facility] EMERGENCY DEPARTMENT Provider Note   CSN: [id number] Arrival date & time: 05/05/21  2013     History Chief Complaint  Patient presents with  . IVC  . Homicidal    Damon Olson is a 45 y.o. male with a history of anxiety and tobacco abuse who presents to the emergency department under IVC.  On arrival patient states he is here due to IVC, he has no complaints or concerns at this time other than stating that he is trying to keep his anxiety under control.  He denies SI, HI, hallucinations, or physical complaints No specific alleviating/aggravating factors.  Per IVC paperwork "respondent suffers from anxiety and has been prescribed Celexa.  Today he stated he would kill everyone including himself, the children, and the petitioner.  Respondent threatened to burn down the house.  He abuses alcohol and smokes marijuana.  Responded also was mixing Xanax with alcohol."  Additional history obtained from police, they relate that initially they were called out to the house for a domestic dispute, patient had a large knife and told a live in roommate that today was the day he was going to kill himself and everyone in the house. Patient had reported having access to a AR15 and a knife. Additional residents were removed from the house and patient's significant other filled out IVC paperwork. Patient has been cooperative with police.   HPI     Past Medical History:  Diagnosis Date  . Anxiety     There are no problems to display for this patient.   History reviewed. No pertinent surgical history.     History reviewed. No pertinent family history.  Social History   Tobacco Use  . Smoking status: Current Every Day Smoker    Types: Cigarettes  . Smokeless tobacco: Never Used  Substance Use Topics  . Alcohol use: Yes  . Drug use: No    Home Medications Prior to Admission medications   Medication Sig Start Date End Date Taking? Authorizing Provider   azithromycin (ZITHROMAX Z-PAK) 250 MG tablet Take one daily for 4 days 12/19/17   Mackuen, Courteney Lyn, MD  citalopram (CELEXA) 20 MG tablet Take 20 mg by mouth daily.    [provider]  guaiFENesin-codeine 100-10 MG/5ML syrup Take 5 mLs by mouth every 6 (six) hours as needed for cough. 12/19/17   Mackuen, Courteney Lyn, MD  HYDROcodone-acetaminophen (NORCO/VICODIN) 5-325 MG tablet Take 1 tablet by mouth every 6 (six) hours as needed for moderate pain. 11/30/17   Lawyer, Cristal Deer, PA-C  ibuprofen (ADVIL,MOTRIN) 800 MG tablet Take 1 tablet (800 mg total) by mouth every 8 (eight) hours as needed. 11/30/17   Charlestine Night, PA-C    Allergies    Patient has no known allergies.  Review of Systems   Review of Systems  Constitutional: Negative for chills and fever.  Respiratory: Negative for shortness of breath.   Cardiovascular: Negative for chest pain.  Gastrointestinal: Negative for abdominal pain and vomiting.  Neurological: Negative for syncope.  Psychiatric/Behavioral: Positive for suicidal ideas (per IVC, denies per patient). Negative for hallucinations.  All other systems reviewed and are negative.   Physical Exam Updated Vital Signs BP (!) 125/91 (BP Location: Right Arm)   Pulse 68   Temp 99.4 F (37.4 C) (Oral)   Resp 18   Ht 6\' 1"  (1.854 m)   Wt 60.8 kg   SpO2 99%   BMI 17.68 kg/m   Physical Exam Vitals and nursing note reviewed.  Constitutional:      General: He is not in acute distress.    Appearance: He is well-developed. He is not toxic-appearing.  HENT:     Head: Normocephalic and atraumatic.  Eyes:     General:        Right eye: No discharge.        Left eye: No discharge.     Conjunctiva/sclera: Conjunctivae normal.  Cardiovascular:     Rate and Rhythm: Normal rate and regular rhythm.  Pulmonary:     Effort: Pulmonary effort is normal. No respiratory distress.     Breath sounds: Normal breath sounds. No wheezing, rhonchi or rales.   Abdominal:     General: There is no distension.     Palpations: Abdomen is soft.     Tenderness: There is no abdominal tenderness. There is no guarding or rebound.     Comments: Prior surgical scar.   Musculoskeletal:     Cervical back: Neck supple.  Skin:    General: Skin is warm and dry.     Findings: No rash.  Neurological:     Mental Status: He is alert.     Comments: Clear speech.   Psychiatric:        Behavior: Behavior is cooperative.     ED Results / Procedures / Treatments   Labs (all labs ordered are listed, but only abnormal results are displayed) Labs Reviewed  COMPREHENSIVE METABOLIC PANEL - Abnormal; Notable for the following components:      Result Value   Potassium 3.3 (*)    Glucose, Bld 110 (*)    Calcium 8.6 (*)    Total Protein 6.4 (*)    All other components within normal limits  ETHANOL - Abnormal; Notable for the following components:   Alcohol, Ethyl (B) 82 (*)    All other components within normal limits  SALICYLATE LEVEL - Abnormal; Notable for the following components:   Salicylate Lvl <7.0 (*)    All other components within normal limits  ACETAMINOPHEN LEVEL - Abnormal; Notable for the following components:   Acetaminophen (Tylenol), Serum <10 (*)    All other components within normal limits  CBC - Abnormal; Notable for the following components:   RBC 4.11 (*)    All other components within normal limits  RAPID URINE DRUG SCREEN, HOSP PERFORMED - Abnormal; Notable for the following components:   Tetrahydrocannabinol POSITIVE (*)    All other components within normal limits  RESP PANEL BY RT-PCR (FLU A&B, COVID) ARPGX2    EKG None  Radiology No results found.  Procedures Procedures   Medications Ordered in ED Medications - No data to display  ED Course  I have reviewed the triage vital signs and the nursing notes.  Pertinent labs & imaging results that were available during my care of the patient were reviewed by me and  considered in my medical decision making (see chart for details).    MDM Rules/Calculators/A&P                         Patient presents to the ED for behavioral health assessment under IVC.  Additional history obtained:  Additional history obtained from chart review, IVC paperwork, and discussion with police.     Lab Tests:  Screening labs have been reviewed including CBC, CMP, acetaminophen/salicylate/ethanol level, UDS: ethanol level elevated, UDS positive for tetrahydrocannabinol, and mild hypokalemia noted which will be orally replaced.  Mild hypocalcemia and mildly low protein.  ED Course:  Patient is medically cleared. Consult placed to TTS. Disposition per [facility].   The patient has been placed in psychiatric observation due to the need to provide a safe environment for the patient while obtaining psychiatric consultation and evaluation, as well as ongoing medical and medication management to treat the patient's condition.  The patient has been placed under full IVC at this time.  Patient to be admitted to [facility].   Portions of this note were generated with Scientist, clinical (histocompatibility and immunogenetics). Dictation errors may occur despite best attempts at proofreading.  Final Clinical Impression(s) / ED Diagnoses Final diagnoses:  Involuntary commitment  Homicidal ideation    Rx / DC Orders ED Discharge Orders    None       Cherly Anderson, PA-C 05/06/21 0545    Gilda Crease, MD 05/07/21 2333

## 2021-05-06 NOTE — ED Notes (Signed)
Pt belongings in locker 3.  

## 2021-05-06 NOTE — BHH Group Notes (Signed)
LCSW Group Therapy Note 05/06/2021 1:15pm  Type of Therapy and Topic:  Group Therapy:  Setting Goals  Participation Level:  Active  Description of Group: In this process group, patients discussed using strengths to work toward goals and address challenges.  Patients identified two positive things about themselves and one goal they were working on.  Patients were given the opportunity to share openly and support each other's plan for self-empowerment.  The group discussed the value of gratitude and were encouraged to have a daily reflection of positive characteristics or circumstances.  Patients were encouraged to identify a plan to utilize their strengths to work on current challenges and goals.  Therapeutic Goals 1. Patient will verbalize personal strengths/positive qualities and relate how these can assist with achieving desired personal goals 2. Patients will verbalize affirmation of peers plans for personal change and goal setting 3. Patients will explore the value of gratitude and positive focus as related to successful achievement of goals 4. Patients will verbalize a plan for regular reinforcement of personal positive qualities and circumstances.  Summary of Patient Progress:Pt shared during introductions and group. Pt shared insight and is goal driven.     Therapeutic Modalities Cognitive Behavioral Therapy Motivational Interviewing    Sherard Sutch M Langford Carias, LCSWA 05/06/2021 1:41 PM 

## 2021-05-06 NOTE — ED Notes (Signed)
Pt brought in with IVC papers. Per [employer], pt threatened to kill his family and papers were taken out by his wife. Pt reports HI but denies SI.

## 2021-05-06 NOTE — BHH Group Notes (Signed)
Occupational Therapy Group Note Date: 05/06/2021 Group Topic/Focus: Coping Skills  Group Description: Group encouraged increased engagement and participation through discussion and activity focused on Mental Health Awareness Month. May is Mental Health Awareness month and patients engaged in discussion surrounding the stigma surrounding mental health and feelings that they wanted to share/express. Patients were encouraged to engage in activity in which they created mental health ribbons that represented different diagnoses, symptoms, and things in which they would like to bring awareness too. Completed ribbons were then displayed around the unit for mental health awareness month.  Participation Level: Patient did not attend OT group session despite personal invitation.    Plan: Continue to engage patient in OT groups 2 - 3x/week.  05/06/2021  Alucard Fearnow, MOT, OTR/L 

## 2021-05-07 LAB — T4, FREE: Free T4: 0.76 ng/dL (ref 0.61–1.12)

## 2021-05-07 MED ORDER — ENSURE ENLIVE PO LIQD
237.0000 mL | Freq: Three times a day (TID) | ORAL | Status: DC
  Administered 2021-05-07 – 2021-05-13 (×17): 237 mL via ORAL
  Filled 2021-05-07 (×24): qty 237

## 2021-05-07 MED ORDER — POTASSIUM CHLORIDE CRYS ER 20 MEQ PO TBCR
20.0000 meq | EXTENDED_RELEASE_TABLET | Freq: Two times a day (BID) | ORAL | Status: AC
  Administered 2021-05-07 – 2021-05-08 (×2): 20 meq via ORAL
  Filled 2021-05-07 (×4): qty 1

## 2021-05-07 NOTE — Progress Notes (Signed)
Recreation Therapy Notes  Animal-Assisted Activity (AAA) Program Checklist/Progress Notes Patient Eligibility Criteria Checklist & Daily Group note for Rec Tx Intervention  Date: 5.24.22 Time: 1430 Location: [address]   AAA/T Program Assumption of Risk Form signed by Engineer, production or Parent Legal Guardian YES  Patient is free of allergies or severe asthma YES  Patient reports no fear of animals YES  Patient reports no history of cruelty to animals YES   Patient understands his/her participation is voluntary YES   Patient washes hands before animal contact YES   Patient washes hands after animal contact YES   Education: Charity fundraiser, Appropriate Animal Interaction   Education Outcome: Acknowledges understanding/In group clarification offered/Needs additional education.   Clinical Observations/Feedback: Pt did not attend activity.     Caroll Rancher, LRT/CTRS         Caroll Rancher A 05/07/2021 3:30 PM

## 2021-05-07 NOTE — Progress Notes (Signed)
Progress note    05/07/21 0805  Psych Admission Type (Psych Patients Only)  Admission Status Involuntary  Psychosocial Assessment  Patient Complaints Agitation;Anger;Anxiety  Eye Contact Intense  Facial Expression Angry;Animated;Anxious  Affect Angry;Anxious;Apprehensive  Speech Aggressive;Logical/coherent  Interaction Avoidant;Cautious;Forwards little;Guarded;Minimal  Motor Activity Fidgety  Appearance/Hygiene Unremarkable  Behavior Characteristics Unwilling to participate;Agressive verbally;Agitated;Anxious;Resistant to care  Mood Anxious;Labile;Angry;Ambivalent;Pleasant  Thought Administrator, sports thinking  Content Blaming others  Delusions Controlled;Persecutory  Perception WDL  Hallucination None reported or observed  Judgment Poor  Confusion None  Danger to Self  Current suicidal ideation? Denies  Danger to Others  Danger to Others None reported or observed

## 2021-05-07 NOTE — Progress Notes (Signed)
Pt stays in his room, pt minimizes his Sx . Pt stated he was great. Pt given HS medication     05/07/21 2000  Psych Admission Type (Psych Patients Only)  Admission Status Involuntary  Psychosocial Assessment  Patient Complaints Anxiety  Eye Contact Intense  Facial Expression Angry;Animated;Anxious  Affect Angry;Anxious;Apprehensive  Speech Aggressive;Logical/coherent  Interaction Avoidant;Cautious;Forwards little;Guarded;Minimal  Motor Activity Fidgety  Appearance/Hygiene Unremarkable  Behavior Characteristics Unwilling to participate  Mood Anxious;Labile  Thought Administrator, sports thinking  Content Blaming others  Delusions Controlled;Persecutory  Perception WDL  Hallucination None reported or observed  Judgment Poor  Confusion None  Danger to Self  Current suicidal ideation? Denies  Danger to Others  Danger to Others None reported or observed

## 2021-05-07 NOTE — Plan of Care (Signed)
  Problem: Education: Goal: Ability to state activities that reduce stress will improve Outcome: Not Progressing   Problem: Education: Goal: Utilization of techniques to improve thought processes will improve Outcome: Not Progressing   Problem: Education: Goal: Knowledge of [place] [facility] Education information/materials will improve Outcome: Not Progressing

## 2021-05-07 NOTE — BHH Group Notes (Signed)
Adult Psychoeducational Group Note  Date:  05/07/2021 Time:  9:18 AM  Group Topic/Focus:  Goals Group:   The focus of this group is to help patients establish daily goals to achieve during treatment and discuss how the patient can incorporate goal setting into their daily lives to aide in recovery.  Participation Level:  Active  Participation Quality:  Appropriate and Attentive  Affect:  Appropriate  Cognitive:  Alert and Appropriate  Insight: Appropriate and Good  Engagement in Group:  Engaged  Modes of Intervention:  Discussion  Additional Comments:  Pt had a goal of staying focused on discharge. Pt believes that he should not be here anymore. Afternoon group materials were handed out and pt was encouraged to complete the packet to participate in the later group.   Damon Olson Damon Olson 05/07/2021, 9:18 AM

## 2021-05-07 NOTE — Plan of Care (Signed)
  Problem: Education: Goal: Ability to state activities that reduce stress will improve Outcome: Progressing   Problem: Education: Goal: Utilization of techniques to improve thought processes will improve Outcome: Progressing   Problem: Education: Goal: Knowledge of [facility] Education information/materials will improve Outcome: Progressing

## 2021-05-07 NOTE — Progress Notes (Signed)
Pt had minimal time on the unit this evening.      05/07/21 0000  Psych Admission Type (Psych Patients Only)  Admission Status Involuntary  Psychosocial Assessment  Patient Complaints Anxiety;Depression  Eye Contact Brief  Facial Expression Anxious;Sad  Affect Depressed;Anxious;Apprehensive;Sad  Speech Logical/coherent  Interaction  (appropriate)  Motor Activity Other (Comment) (appropriate)  Appearance/Hygiene Disheveled  Behavior Characteristics Anxious  Mood Anxious;Depressed  Thought Process  Coherency WDL  Content Blaming others  Delusions None reported or observed  Perception WDL  Hallucination None reported or observed  Judgment Poor  Confusion WDL  Danger to Self  Current suicidal ideation? Denies  Danger to Others  Danger to Others None reported or observed

## 2021-05-07 NOTE — Progress Notes (Addendum)
[facility] MD Progress Note  05/07/2021 3:11 PM KENDARIUS VIGEN  MRN:  [id number]   Reason for admission:  [name] a 45 year old male with a history of anxiety and panic attacks well as alcohol use disorder and cannabis use who was admitted on IVC takenout by his wife for reportedly threatening to kill everyone in his house and threatening to burn the house down.  The patient also reportedly has made paranoid statements regarding brother and roommate prior to admission.  Objective: Medical record reviewed.  Patient's case discussed in detail with members of the treatment team.  I met with and evaluated the patient today on the unit for follow-up.  Patient is cooperative and polite but overly familiar during our conversation.  He maintains good eye contact.  Speech is not pressured.  He is calm.  Thought processes are coherent and goal-directed.  He reports good appetite and sleep.  The patient describes his mood as good.  He maintains that he does not need to be in the hospital and is confused as to why he is here.  He denies having made threats to harm anyone, kill anyone or kill himself prior to admission.  The patient denies passive wish for death, SI, AI or HI.  Patient reports some suspiciousness as to why his brothers are attempting to call the unit and reports they have been "nosy" about his business both prior to and during his hospital admission.  The patient states that 2 of his brothers have alcohol problems and he does not maintain a relationship with them.  Patient himself denies that he has an alcohol problem but states he is willing to go to a program if he can leave the hospital.  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  Patient states that he has called his wife but she does not want to speak to him and will not take his calls.  He expresses concern about the roommate who remains in his home while patient is in the hospital.  His hours of sleep  are not recorded.  Most recent vital signs today with BP of 123/91 and pulse of 79.  He reports that he took his standing dose olanzapine last night but MAR review indicates that he did not.  He has been declining standing dose lorazepam.  He is not displaying any signs or symptoms of alcohol withdrawal.  He has been minimally present in the milieu but did attend some groups.  Staff document that patient continues to state that he should not be in the hospital.  Labs yesterday evening included CMP with potassium of 3.1, glucose of 152 and otherwise WNL.  TSH of 2.472 and free T4 of 0.76.  Principal Problem: Unspecified mood (affective) disorder (HCC) Diagnosis: Principal Problem:   Unspecified mood (affective) disorder (HCC) Active Problems:   Substance induced mood disorder (HCC)   Panic disorder   Marijuana use   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HCC)  Total Time spent with patient: 20 minutes  Past Psychiatric History: See admission H&P  Past Medical History:  Past Medical History:  Diagnosis Date  . Anxiety   . Depression   . Marijuana use 05/06/2021  . 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[place]) 05/06/2021  . Panic disorder 05/06/2021  . Unspecified mood (affective) disorder ([place]) 05/06/2021   History reviewed. No pertinent surgical history. Family History: History reviewed. No pertinent family history. Family Psychiatric  History: See admission H&P Social History:  Social History  Substance and Sexual Activity  Alcohol Use Yes   Comment: 3-4 glasses wine/beer every other day     Social History   Substance and Sexual Activity  Drug Use No    Social History   Socioeconomic History  . Marital status: Significant Other    Spouse name: Not on file  . Number of children: Not on file  . Years of education: Not on file  . Highest education level: Not on file  Occupational History  . Not on file  Tobacco Use  . Smoking status: Current Every Day Smoker    Packs/day: 1.00    Years:  15.00    Pack years: 15.00    Types: Cigarettes  . Smokeless tobacco: Never Used  Substance and Sexual Activity  . Alcohol use: Yes    Comment: 3-4 glasses wine/beer every other day  . Drug use: No  . Sexual activity: Not on file  Other Topics Concern  . Not on file  Social History Narrative  . Not on file   Social Determinants of Health   Financial Resource Strain: Not on file  Food Insecurity: Not on file  Transportation Needs: Not on file  Physical Activity: Not on file  Stress: Not on file  Social Connections: Not on file   Additional Social History:    Pain Medications: see MAR Prescriptions: see MAR Over the Counter: SEE MAR History of alcohol / drug use?: Yes Longest period of sobriety (when/how long): unsure Negative Consequences of Use: Financial,Personal relationships,Work / School Withdrawal Symptoms: Other (Comment) (denied withdrawals) Name of Substance 1: alcohol 1 - Age of First Use: 45 yrs old 1 - Amount (size/oz): 3-4 drinks every other day 1 - Frequency: every other day 1 - Duration: 20 plus yrs 1 - Last Use / Amount: 2 days ago 1 - Method of Aquiring: purchases 1- Route of Use: p.o.                  Sleep: Good  Appetite:  Fair  Current Medications: Current Facility-Administered Medications  Medication Dose Route Frequency Provider Last Rate Last Admin  . acetaminophen (TYLENOL) tablet 650 mg  650 mg Oral Q6H PRN Prescilla Sours, PA-C      . alum & mag hydroxide-simeth (MAALOX/MYLANTA) 200-200-20 MG/5ML suspension 30 mL  30 mL Oral Q4H PRN Margorie John W, PA-C      . feeding supplement (ENSURE ENLIVE / ENSURE PLUS) liquid 237 mL  237 mL Oral TID BM Sharma Covert, MD   237 mL at 05/07/21 1149  . hydrOXYzine (ATARAX/VISTARIL) tablet 25 mg  25 mg Oral TID PRN Prescilla Sours, PA-C      . loperamide (IMODIUM) capsule 2-4 mg  2-4 mg Oral PRN Sharma Covert, MD      . LORazepam (ATIVAN) tablet 1 mg  1 mg Oral Q6H PRN Sharma Covert,  MD      . OLANZapine zydis (ZYPREXA) disintegrating tablet 5 mg  5 mg Oral Q8H PRN Sharma Covert, MD       And  . LORazepam (ATIVAN) tablet 1 mg  1 mg Oral Q6H PRN Sharma Covert, MD       And  . ziprasidone (GEODON) injection 20 mg  20 mg Intramuscular Q6H PRN Sharma Covert, MD      . magnesium hydroxide (MILK OF MAGNESIA) suspension 30 mL  30 mL Oral Daily PRN Prescilla Sours, PA-C      . multivitamin with  minerals tablet 1 tablet  1 tablet Oral Daily Sharma Covert, MD   1 tablet at 05/06/21 0900  . OLANZapine (ZYPREXA) tablet 7.5 mg  7.5 mg Oral QHS Arthor Captain, MD      . ondansetron (ZOFRAN-ODT) disintegrating tablet 4 mg  4 mg Oral Q6H PRN Sharma Covert, MD      . thiamine tablet 100 mg  100 mg Oral Daily Sharma Covert, MD   100 mg at 05/06/21 0900  . traZODone (DESYREL) tablet 50 mg  50 mg Oral QHS PRN Prescilla Sours, PA-C        Lab Results:  Results for orders placed or performed during the hospital encounter of 05/06/21 (from the past 48 hour(s))  TSH     Status: None   Collection Time: 05/06/21  6:02 PM  Result Value Ref Range   TSH 2.472 0.350 - 4.500 uIU/mL    Comment: Performed by a 3rd Generation assay with a functional sensitivity of <=0.01 uIU/mL. Performed at [facility], [place] [address] [place], [place] [address]   T4, free     Status: None   Collection Time: 05/06/21  6:02 PM  Result Value Ref Range   Free T4 0.76 0.61 - 1.12 ng/dL    Comment: (NOTE) Biotin ingestion may interfere with free T4 tests. If the results are inconsistent with the TSH level, previous test results, or the clinical presentation, then consider biotin interference. If needed, order repeat testing after stopping biotin. Performed at [facility], [place] [address] [place], [place] [address]   Comprehensive metabolic panel     Status: Abnormal   Collection Time: 05/06/21  6:02 PM  Result Value Ref Range   Sodium 138 135 - 145 mmol/L    Potassium 3.1 (L) 3.5 - 5.1 mmol/L   Chloride 104 98 - 111 mmol/L   CO2 25 22 - 32 mmol/L   Glucose, Bld 152 (H) 70 - 99 mg/dL    Comment: Glucose reference range applies only to samples taken after fasting for at least 8 hours.   BUN 8 6 - 20 mg/dL   Creatinine, Ser 0.65 0.61 - 1.24 mg/dL   Calcium 9.5 8.9 - 10.3 mg/dL   Total Protein 7.5 6.5 - 8.1 g/dL   Albumin 4.5 3.5 - 5.0 g/dL   AST 34 15 - 41 U/L   ALT 30 0 - 44 U/L   Alkaline Phosphatase 52 38 - 126 U/L   Total Bilirubin 1.2 0.3 - 1.2 mg/dL   GFR, Estimated >60 >60 mL/min    Comment: (NOTE) Calculated using the CKD-EPI Creatinine Equation (2021)    Anion gap 9 5 - 15    Comment: Performed at [facility], [place] [address], [place], [place] [address]    Blood Alcohol level:  Lab Results  Component Value Date   ETH 82 (H) 25/36/6440    Metabolic Disorder Labs: Lab Results  Component Value Date   HGBA1C 5.4 05/05/2021   MPG 108.28 05/05/2021   No results found for: PROLACTIN Lab Results  Component Value Date   CHOL 176 05/05/2021   TRIG 582 (H) 05/05/2021   HDL 62 05/05/2021   CHOLHDL 2.8 05/05/2021   VLDL UNABLE TO CALCULATE IF TRIGLYCERIDE OVER 400 mg/dL 05/05/2021   LDLCALC UNABLE TO CALCULATE IF TRIGLYCERIDE OVER 400 mg/dL 05/05/2021    Physical Findings: AIMS: Facial and Oral Movements Muscles of Facial Expression: None, normal Lips and Perioral Area:  None, normal Jaw: None, normal Tongue: None, normal,Extremity Movements Upper (arms, wrists, hands, fingers): None, normal Lower (legs, knees, ankles, toes): None, normal, Trunk Movements Neck, shoulders, hips: None, normal, Overall Severity Severity of abnormal movements (highest score from questions above): None, normal Incapacitation due to abnormal movements: None, normal Patient's awareness of abnormal movements (rate only patient's report): No Awareness, Dental Status Current problems with teeth and/or dentures?: Yes Does  patient usually wear dentures?: No  CIWA:  CIWA-Ar Total: 0 COWS:  COWS Total Score: 1  Musculoskeletal: Strength & Muscle Tone: within normal limits Gait & Station: normal Patient leans: N/A  Psychiatric Specialty Exam:  Presentation  General Appearance: Appropriate for Environment; Casual  Eye Contact:Good  Speech:Clear and Coherent; Normal Rate  Speech Volume:Normal  Handedness:No data recorded  Mood and Affect  Mood:Euthymic  Affect:Full Range; Other (comment) (Inappropriately bright given the situation)   Thought Process  Thought Processes:Coherent; Goal Directed  Descriptions of Associations:Intact  Orientation:Full (Time, Place and Person)  Thought Content:Paranoid Ideation  History of Schizophrenia/Schizoaffective disorder:No  Duration of Psychotic Symptoms:Less than six months  Hallucinations:Hallucinations: None  Ideas of Reference:None  Suicidal Thoughts:Suicidal Thoughts: No  Homicidal Thoughts:Homicidal Thoughts: No   Sensorium  Memory:Immediate Good; Recent Poor; Remote Good  Judgment:Fair  Insight:Lacking   Executive Functions  Concentration:Good  Attention Span:Good  [place] of Knowledge:Good  Language:Good   Psychomotor Activity  Psychomotor Activity:Psychomotor Activity: Normal   Assets  Assets:Communication Skills; Housing; Physical Health; Resilience; Social Support   Sleep  Sleep:Sleep: Good (Number of hours not recorded)    Physical Exam: Physical Exam Vitals and nursing note reviewed.  HENT:     Head: Normocephalic and atraumatic.  Pulmonary:     Effort: Pulmonary effort is normal.  Neurological:     General: No focal deficit present.     Mental Status: He is alert and oriented to person, place, and time.    Review of Systems  Constitutional: Negative.   Respiratory: Negative.   Cardiovascular: Negative.   Gastrointestinal: Negative.   Neurological: Negative.   Psychiatric/Behavioral:  Negative for depression, hallucinations and suicidal ideas. The patient does not have insomnia.    Blood pressure (!) 123/91, pulse 79, temperature 98.6 F (37 C), temperature source Oral, resp. rate 18, height $RemoveBe'6\' 1"'LlyAIUODB$  (1.854 m), weight 59 kg, SpO2 100 %. Body mass index is 17.15 kg/m.   Treatment Plan Summary: Daily contact with patient to assess and evaluate symptoms and progress in treatment and Medication management   Continue on IVC status  Continue every 15-minute observation level  Encourage participation in group therapy and therapeutic milieu  Mood disorder -Continue olanzapine 7.5 mg at bedtime for mood stabilization and paranoia  Alcohol use disorder -Continue on CIWA protocol with PRN lorazepam, thiamine, multivitamin and comfort meds.  Patient is not reporting symptoms or displaying signs of alcohol withdrawal.  Have discontinued standing dose lorazepam as patient was not taking and vital signs have remained stable since admission. -Patient would benefit from residential substance abuse treatment program participation after discharge if he is willing to participate  Anxiety -Continue hydroxyzine 25 mg 3 times daily PRN  Insomnia -Continue trazodone 50 mg at bedtime PRN  Low potassium -Give Klor-Con 20 mEq twice daily x2 doses and repeat BMP on evening of 05/08/2021.  Disposition planning in progress.   Arthor Captain, MD 05/07/2021, 3:11 PM

## 2021-05-07 NOTE — BHH Group Notes (Signed)
Adult Psychoeducational Group Note  Date:  05/07/2021 Time:  4:59 PM  Group Topic/Focus:  Personal Choices and Values:   The focus of this group is to help patients assess and explore the importance of values in their lives, how their values affect their decisions, how they express their values and what opposes their expression.  Participation Level:  Active  Participation Quality:  Appropriate and Attentive  Affect:  Appropriate  Cognitive:  Alert and Appropriate  Insight: Appropriate and Good  Engagement in Group:  Engaged  Modes of Intervention:  Discussion  Additional Comments:  Pt attended icebreaker activity with MHT this afternoon.   Deforest Hoyles Leeon Makar 05/07/2021, 4:59 PM

## 2021-05-07 NOTE — BHH Counselor (Signed)
Adult Comprehensive Assessment  Patient ID: Damon Olson, male   DOB: Dec 01, 1976, 45 y.o.   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, forensic Family Relationships: Pt reports no current relationship with his 3 brothers Surveyor, quantity / Lack of resources (include bankruptcy): Pt reports no stressors Housing / Lack of housing: Pt reports living with his domestic partner and 4 children Physical health (include injuries & life threatening diseases): Pt reports no stressors Social relationships: Pt reports no stressors Substance abuse: Pt reports using THC and alcohol socially Bereavement / Loss: Pt reports mother passed away 6 years ago and father passed away 5 months ago  Living/Environment/Situation:  Living Arrangements: Spouse/significant other,Children Living conditions (as described by patient or guardian): "It's an awesome home" Who else lives in the home?: Domestic Partner and 4 children How long has patient lived in current situation?: 5 years What is atmosphere in current home: Comfortable,Supportive  Family History:  Marital status: Long term relationship Long term relationship, how long?: 20 years What types of issues is patient dealing with in the relationship?: None Are you sexually active?: Yes What is your sexual orientation?: Heterosexual Has your sexual activity been affected by drugs, alcohol, medication, or emotional stress?: NO Does patient have children?: Yes How many children?: 4 How is patient's relationship with their children?: "They are awesome and I love  them"  Childhood History:  By whom was/is the patient raised?: Mother,Father Description of patient's relationship with caregiver when they were a child: "It was amazing" Patient's description of current relationship with people who raised him/her: "My mother passed 6 years ago and father passed 6 months ago" How were you disciplined when you got in trouble as a child/adolescent?: Groundings Does patient have siblings?: Yes Number of Siblings: 3 Description of patient's current relationship with siblings: "I have 3 older brother's but they live in [place] and get into trouble so I dont talk to them" Did patient suffer any verbal/emotional/physical/sexual abuse as a child?: No Did patient suffer from severe childhood neglect?: No Has patient ever been sexually abused/assaulted/raped as an adolescent or adult?: No Was the patient ever a victim of a crime or a disaster?: No Witnessed domestic violence?: No Has patient been affected by domestic violence as an adult?: No  Education:  Highest grade of school patient has completed: 12th Grade Currently a student?: No Learning disability?: No  Employment/Work Situation:   Employment situation: Employed 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 How long has patient been employed?: 1 year Patient's job has been impacted by current illness: No What is the longest time patient has a held a job?: 10 years in [place] Where was the patient employed at that time?: [employer] Has patient ever been in the [employer]?: No  Financial Resources:   Financial resources: Income from employment Does patient have a representative payee or guardian?: No  Alcohol/Substance Abuse:   What has been your use of drugs/alcohol within the last 12 months?: Pt reports using THC and Alcohol socially If attempted suicide, did drugs/alcohol play a role in this?: No Alcohol/Substance Abuse Treatment Hx: Denies past history Has  alcohol/substance abuse ever caused legal problems?: No  Social Support System:   Patient's Community Support System: Fair Museum/gallery exhibitions officer System: Media planner and Co-Workers Type of faith/religion: None How does patient's faith help to cope with current illness?: None  Leisure/Recreation:   Do You Have Hobbies?: Yes Leisure and Hobbies: Art, video games, painting, basketball, outdoor activities, and family time  Strengths/Needs:   What is the patient's perception of their strengths?: Art, basketball, and being a father Patient states they can use these personal strengths during their treatment to contribute to their recovery: "It helps me remain focused on what is important" Patient states these barriers may affect/interfere with their treatment: None Patient states these barriers may affect their return to the community: None Other important information patient would like considered in planning for their treatment: None  Discharge Plan:   Currently receiving community mental health services: No Patient states concerns and preferences for aftercare planning are: Pt would like to have a therapist and a psychiatrist Patient states they will know when they are safe and ready for discharge when: "I am ready to go now" Does patient have access to transportation?: Yes (Pt reports having a car at home) Does patient have financial barriers related to discharge medications?: No Will patient be returning to same living situation after discharge?: Yes  Summary/Recommendations:   Summary and Recommendations (to be completed by the evaluator): Damon Olson is a 45 year old, AA, male who was admitted to the hospital due to a report of him barricading himself in his home and threatening his family with a firearm.  The Pt reports that this did not occur and that he is not sure why he is at the hospital.  The Pt reports that he lives with his domestic partner of 20 years and their 4  children.  The Pt reports that his mother passed away 6 years ago and his father passed away 6 months ago.  The only family contact the Pt admits to is his 3 older brothers in [place] but states that he has limited to no contact with them due to behaviors he states he does not want to be a part of.  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  The Pt reports no depression, SI, HI, or AVH.  The Pt reports no stressors that he believes would bring him into the hospital.  While in the hospital the Pt can benefit from crisis stabilization, medication evaluation, group therapy, psycho-education, case management, and discharge planning.  Upon discharge the Pt would like to return home with his domestic partner and would like to follow up with a local mental health provider for therapy and medication management.  Aram Beecham. 05/07/2021

## 2021-05-07 NOTE — Progress Notes (Signed)
NUTRITION ASSESSMENT  Pt identified as at risk on the Malnutrition Screen Tool  INTERVENTION: 1. Supplements: Ensure Enlive po TID, each supplement provides 350 kcal and 20 grams of protein  NUTRITION DIAGNOSIS: Unintentional weight loss related to sub-optimal intake as evidenced by pt report.   Goal: Pt to meet >/= 90% of their estimated nutrition needs.  Monitor:  PO intake  Assessment:  Pt admitted for substance abuse (THC, EtOH).  Pt with decreased appetite. Is underweight based on BMI. Per weight records, weight has decreased since 2018. Will order Ensure supplements for additional kcals and protein.   Height: Ht Readings from Last 1 Encounters:  05/06/21 6\' 1"  (1.854 m)    Weight: Wt Readings from Last 1 Encounters:  05/06/21 59 kg    Weight Hx: Wt Readings from Last 10 Encounters:  05/06/21 59 kg  05/05/21 60.8 kg  11/30/17 71.2 kg  05/23/15 72.6 kg    BMI:  Body mass index is 17.15 kg/m. Pt meets criteria for underweight based on current BMI.  Estimated Nutritional Needs: Kcal: 25-30 kcal/kg Protein: > 1 gram protein/kg Fluid: 1 ml/kcal  Diet Order:  Diet Order            Diet regular Room service appropriate? Yes; Fluid consistency: Thin  Diet effective now                Pt is also offered choice of unit snacks mid-morning and mid-afternoon.  Pt is eating as desired.   Lab results and medications reviewed.   07/23/15, MS, RD, LDN Inpatient Clinical Dietitian Contact information available via Amion

## 2021-05-07 NOTE — BHH Counselor (Signed)
CSW spoke with [employer] concerning call that led pt to the hospital. [employer] reports that they only have what the wife reported. There is no notation in the police report that anyone reported that pt was threatening to burn the house down. Pt told the police where his firearms were in the home, including the one his wife hid and all were obtained by [employer]. Pt had made a call to [employer] the same morning because he wanted to room mate to leave and his wife did not. Pt was calm with the police and showed no signs of distress or animation.   Fredirick Lathe, LCSWA Clinicial Social Worker [employer]

## 2021-05-07 NOTE — BHH Suicide Risk Assessment (Signed)
BHH INPATIENT:  Family/Significant Other Suicide Prevention Education  Suicide Prevention Education:  Education Completed; Damon Olson [phone] (Media planner) has been identified by the patient as the family member/significant other with whom the patient will be residing, and identified as the person(s) who will aid the patient in the event of a mental health crisis (suicidal ideations/suicide attempt).  With written consent from the patient, the family member/significant other has been provided the following suicide prevention education, prior to the and/or following the discharge of the patient.  The suicide prevention education provided includes the following:  Suicide risk factors  Suicide prevention and interventions  National Suicide Hotline telephone number  [facility] assessment telephone number  [facility] Emergency Assistance 911  [place] and/or Residential Mobile Crisis Unit telephone number  Request made of family/significant other to:  Remove weapons (e.g., guns, rifles, knives), all items previously/currently identified as safety concern.    Remove drugs/medications (over-the-counter, prescriptions, illicit drugs), all items previously/currently identified as a safety concern.  The family member/significant other verbalizes understanding of the suicide prevention education information provided.  The family member/significant other agrees to remove the items of safety concern listed above.  CSW spoke with Damon Olson, Damon Olson who states that her partner has been abusing alcohol for many years and has never attempted to get treatment for the alcohol use.  Damon Olson. Damon Olson states that her partner has been making delusional statements such as people and family members are coming from [place] to rob them or have already robbed them, that his ankles are broken and he cannot walk, and that she spent 100,000 on [employer].  Damon Olson. Damon Olson states that her partner and she  went to [place] approximately 3 weeks ago and their friends and family attempted an intervention for him that did not go well.  She states that once they returned home he became more short tempered and paranoid about various things.  She states that approximately a week ago the roommate found her partner in a bedroom with a gun to his head.  Damon Olson. Damon Olson states that she and the roommate then hid all of the firearms in the home.  She states that there was another incident a few days later where her partner was grilling food and had a large knife.  Damon Olson. Damon Olson states that he then threatened to kill himself and his family.  She states that her partner then came into the home and began throwing things down the stairs.  Damon Olson. Damon Olson states that her partner then found a firearm and 1 clip and locked himself in a bedroom until the police were called.  Damon Olson. Damon Olson states that the roommate was attempting to get into the room to get the firearms away from her partner.  Damon Olson. Damon Olson states that she called the police and all of the firearms were removed from the home.  She states that she is afraid of her partner and does not want him back at the home. Damon Olson. Damon Olson also states that she does not want to have a 50B on her partner because she does not want him to go to jail if her tries to come home.  CSW informed Damon Olson. Olson that the hospital cannot keep him from coming to the home after discharge and cannot make him seek further mental health therapy or substance use treatment.  CSW informed Damon Olson. Damon Olson that she will contact her again at the end of the week to see if she still wants her partner to remain  away from the home.  Damon Olson. Damon Olson agreed to this phone call.  CSW completed SPE with [name].   Damon Olson Damon Olson 05/07/2021, 2:56 PM

## 2021-05-07 NOTE — Progress Notes (Signed)
Pt Bp elevated, writer tried to educate pt on importance of maintaining a normal Bp. Pt stated he talked to his nutritionist and as long as he eats salads and drink vinegar he will be ok. Pt encouraged to talk to the doctor, but pt stated he already talked to his own doctor.

## 2021-05-08 DIAGNOSIS — F1290 Cannabis use, unspecified, uncomplicated: Secondary | ICD-10-CM

## 2021-05-08 DIAGNOSIS — F39 Unspecified mood [affective] disorder: Secondary | ICD-10-CM

## 2021-05-08 DIAGNOSIS — F1020 Alcohol dependence, uncomplicated: Secondary | ICD-10-CM

## 2021-05-08 DIAGNOSIS — F1994 Other psychoactive substance use, unspecified with psychoactive substance-induced mood disorder: Secondary | ICD-10-CM

## 2021-05-08 DIAGNOSIS — F410 Panic disorder [episodic paroxysmal anxiety] without agoraphobia: Secondary | ICD-10-CM | POA: Diagnosis not present

## 2021-05-08 LAB — BASIC METABOLIC PANEL
Anion gap: 9 (ref 5–15)
BUN: 13 mg/dL (ref 6–20)
CO2: 31 mmol/L (ref 22–32)
Calcium: 9.8 mg/dL (ref 8.9–10.3)
Chloride: 102 mmol/L (ref 98–111)
Creatinine, Ser: 0.76 mg/dL (ref 0.61–1.24)
GFR, Estimated: >60 mL/min (ref >60)
Glucose, Bld: 100 mg/dL — ABNORMAL HIGH (ref 70–99)
Potassium: 4.5 mmol/L (ref 3.5–5.1)
Sodium: 142 mmol/L (ref 135–145)

## 2021-05-08 NOTE — Progress Notes (Addendum)
D:  Patient denied SI and HI, contracts for safety.  Denied A/V hallucinations  A:  Medications administered per MD orders.  Emotional support and encouragement given patient. R:   Safety maintained with 15 minute checks.  Patient stated he is ready for discharge home.  Children tell him "Daddy when are you coming home?"  Patient's self inventory sheet, patient sleeps good, no sleep medication.  Good appetite, normal energy level, good concentration.  Denied depression, hopeless and anxiety.  Denied withdrawals.  Denied SI.  Denied physical problems.  Denied physical pain.  Goal is stay positive.  Plans to relax.  Does have discharge plans.

## 2021-05-08 NOTE — BHH Group Notes (Signed)
Type of Therapy and Topic:  Group Therapy - Healthy vs Unhealthy Coping Skills  Participation Level:  Active   Description of Group The focus of this group was to determine what unhealthy coping techniques typically are used by group members and what healthy coping techniques would be helpful in coping with various problems. Patients were guided in becoming aware of the differences between healthy and unhealthy coping techniques. Patients were asked to identify 2-3 healthy coping skills they would like to learn to use more effectively.  Therapeutic Goals 1. Patients learned that coping is what human beings do all day long to deal with various situations in their lives 2. Patients defined and discussed healthy vs unhealthy coping techniques 3. Patients identified their preferred coping techniques and identified whether these were healthy or unhealthy 4. Patients determined 2-3 healthy coping skills they would like to become more familiar with and use more often. 5. Patients provided support and ideas to each other   Summary of Patient Progress:  During group, Damon Olson expressed that one of his supports is his work group and that one of his stressors is going back to school. Patient proved open to input from peers and feedback from CSW. Patient demonstrated insight into the subject matter, was respectful of peers, and participated throughout the entire session.

## 2021-05-08 NOTE — BHH Group Notes (Signed)
Adult Psychoeducational Group Note  Date:  05/08/2021 Time:  2:54 PM  Group Topic/Focus:  Overcoming Stress:   The focus of this group is to define stress and help patients assess their triggers.  Participation Level:  Active  Participation Quality:  Appropriate and Attentive  Affect:  Appropriate  Cognitive:  Appropriate  Insight: Appropriate and Good  Engagement in Group:  Engaged  Modes of Intervention:  Activity  Additional Comments:  Pts voted on a movie to watch. They enjoyed a snack as well as some down time with peers.     Deforest Hoyles Koula Venier 05/08/2021, 2:54 PM

## 2021-05-08 NOTE — Progress Notes (Signed)
The patient's positive event for the day was that he had a good day overall. His goal for tomorrow is to remain positive.

## 2021-05-08 NOTE — Plan of Care (Signed)
Nurse discussed coping skills with patient.  

## 2021-05-08 NOTE — Progress Notes (Signed)
[facility] MD Progress Note  05/08/2021 3:08 PM Damon Olson  MRN:  [id number]   Subjective: Damon Olson reports, "I'm doing great, always has. Everyone is pointing their finger at me for reasons unknown to me. I'm not depressed, I have no anxiety symptoms. I'm not withdrawing from nothing. I sleep good. Then, what am I doing here? I don't know. The police brought me to the [facility] hospital last Sunday. I was at my home barbecuing when the cops came. That is all I know. I know my layer will be hearing this because my right is violated here".   Reason for admission:  Damon Olson a 45 year old male with a history of anxiety and panic attacks well as alcohol use disorder and cannabis use who was admitted on IVC takenout by his wife for reportedly threatening to kill everyone in his house and threatening to burn the house down.  The patient also reportedly has made paranoid statements regarding brother and roommate prior to admission.  Objective: Medical record reviewed.  Patient's case discussed in detail with members of the treatment team.  I met with and evaluated the patient today on the unit for follow-up.  Patient is cooperative and polite but overly familiar during our conversation.  He maintains good eye contact.  Speech is not pressured.  He is calm.  Thought processes are coherent and goal-directed.  He reports good appetite and sleep.  The patient describes his mood as good.  He maintains that he does not need to be in the hospital and is confused as to why he is here.  He denies having made threats to harm anyone, kill anyone or kill himself prior to admission.  The patient denies passive wish for death, SI, AI or HI.  Patient reports some suspiciousness as to why his brothers are attempting to call the unit and reports they have been "nosy" about his business both prior to and during his hospital admission.  The patient states that 2 of his brothers have alcohol problems and he does not maintain a  relationship with them.  Patient himself denies that he has an alcohol problem but states he is willing to go to a program if he can leave the hospital.  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  Patient states that he has called his wife but she does not want to speak to him and will not take his calls.  He expresses concern about the roommate who remains in his home while patient is in the hospital.  His hours of sleep are not recorded.  Most recent vital signs today with BP of 123/91 and pulse of 79.  He reports that he took his standing dose olanzapine last night but MAR review indicates that he did not.  He has been declining standing dose lorazepam.  He is not displaying any signs or symptoms of alcohol withdrawal.  He has been minimally present in the milieu but did attend some groups.  Staff document that patient continues to state that he should not be in the hospital.  Labs yesterday evening included CMP with potassium of 3.1, glucose of 152 and otherwise WNL.  TSH of 2.472 and free T4 of 0.76.  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 Anxiety   . Depression   . Marijuana use 05/06/2021  . 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 Panic disorder 05/06/2021  . Unspecified mood (affective) disorder ([place]) 05/06/2021   History reviewed. No pertinent surgical history.  Family History: History reviewed. No pertinent family history.  Family Psychiatric  History: See admission H&P  Social History:  Social History   Substance and Sexual Activity  Alcohol Use  Yes   Comment: 3-4 glasses wine/beer every other day     Social History   Substance and Sexual Activity  Drug Use No    Social History   Socioeconomic History  . Marital status: Significant Other    Spouse name: Not on file  . Number of children: Not on file  . Years of education: Not on file  . Highest education level: Not on file  Occupational History  . Not on file  Tobacco Use  . Smoking status: Current Every Day Smoker    Packs/day: 1.00    Years: 15.00    Pack years: 15.00    Types: Cigarettes  . Smokeless tobacco: Never Used  Substance and Sexual Activity  . Alcohol use: Yes    Comment: 3-4 glasses wine/beer every other day  . Drug use: No  . Sexual activity: Not on file  Other Topics Concern  . Not on file  Social History Narrative  . Not on file   Social Determinants of Health   Financial Resource Strain: Not on file  Food Insecurity: Not on file  Transportation Needs: Not on file  Physical Activity: Not on file  Stress: Not on file  Social Connections: Not on file   Additional Social History:     Pain Medications: see MAR Prescriptions: see MAR Over the Counter: SEE MAR History of alcohol / drug use?: Yes Longest period of sobriety (when/how long): unsure Negative Consequences of Use: Financial,Personal relationships,Work / School Withdrawal Symptoms: Other (Comment) (denied withdrawals) Name of Substance 1: alcohol 1 - Age of First Use: 45 yrs old 1 - Amount (size/oz): 3-4 drinks every other day 1 - Frequency: every other day 1 - Duration: 20 plus yrs 1 - Last Use / Amount: 2 days ago 1 - Method of Aquiring: purchases 1- Route of Use: p.o.  Sleep: Good  Appetite:  Fair  Current Medications: Current Facility-Administered Medications  Medication Dose Route Frequency Provider Last Rate Last Admin  . acetaminophen (TYLENOL) tablet 650 mg  650 mg Oral Q6H PRN Prescilla Sours, PA-C      . alum & mag hydroxide-simeth (MAALOX/MYLANTA) 200-200-20  MG/5ML suspension 30 mL  30 mL Oral Q4H PRN Margorie John W, PA-C      . feeding supplement (ENSURE ENLIVE / ENSURE PLUS) liquid 237 mL  237 mL Oral TID BM Sharma Covert, MD   237 mL at 05/08/21 1057  . hydrOXYzine (ATARAX/VISTARIL) tablet 25 mg  25 mg Oral TID PRN Prescilla Sours, PA-C      . loperamide (IMODIUM) capsule 2-4 mg  2-4 mg Oral PRN Sharma Covert, MD      . LORazepam (ATIVAN) tablet 1 mg  1 mg Oral Q6H PRN Sharma Covert, MD      . OLANZapine zydis (ZYPREXA) disintegrating tablet 5 mg  5 mg Oral Q8H PRN Sharma Covert, MD  And  . LORazepam (ATIVAN) tablet 1 mg  1 mg Oral Q6H PRN Sharma Covert, MD       And  . ziprasidone (GEODON) injection 20 mg  20 mg Intramuscular Q6H PRN Sharma Covert, MD      . magnesium hydroxide (MILK OF MAGNESIA) suspension 30 mL  30 mL Oral Daily PRN Prescilla Sours, PA-C      . multivitamin with minerals tablet 1 tablet  1 tablet Oral Daily Sharma Covert, MD   1 tablet at 05/08/21 [phone]  . OLANZapine (ZYPREXA) tablet 7.5 mg  7.5 mg Oral QHS Arthor Captain, MD   7.5 mg at 05/07/21 2130  . ondansetron (ZOFRAN-ODT) disintegrating tablet 4 mg  4 mg Oral Q6H PRN Sharma Covert, MD      . thiamine tablet 100 mg  100 mg Oral Daily Sharma Covert, MD   100 mg at 05/08/21 4196  . traZODone (DESYREL) tablet 50 mg  50 mg Oral QHS PRN Prescilla Sours, PA-C       Lab Results:  Results for orders placed or performed during the hospital encounter of 05/06/21 (from the past 48 hour(s))  TSH     Status: None   Collection Time: 05/06/21  6:02 PM  Result Value Ref Range   TSH 2.472 0.350 - 4.500 uIU/mL    Comment: Performed by a 3rd Generation assay with a functional sensitivity of <=0.01 uIU/mL. Performed at [facility], [place] [address] [place], [place] [address]   T4, free     Status: None   Collection Time: 05/06/21  6:02 PM  Result Value Ref Range   Free T4 0.76 0.61 - 1.12 ng/dL    Comment:  (NOTE) Biotin ingestion may interfere with free T4 tests. If the results are inconsistent with the TSH level, previous test results, or the clinical presentation, then consider biotin interference. If needed, order repeat testing after stopping biotin. Performed at [facility], [place] [address] [place], [place] [address]   Comprehensive metabolic panel     Status: Abnormal   Collection Time: 05/06/21  6:02 PM  Result Value Ref Range   Sodium 138 135 - 145 mmol/L   Potassium 3.1 (L) 3.5 - 5.1 mmol/L   Chloride 104 98 - 111 mmol/L   CO2 25 22 - 32 mmol/L   Glucose, Bld 152 (H) 70 - 99 mg/dL    Comment: Glucose reference range applies only to samples taken after fasting for at least 8 hours.   BUN 8 6 - 20 mg/dL   Creatinine, Ser 0.65 0.61 - 1.24 mg/dL   Calcium 9.5 8.9 - 10.3 mg/dL   Total Protein 7.5 6.5 - 8.1 g/dL   Albumin 4.5 3.5 - 5.0 g/dL   AST 34 15 - 41 U/L   ALT 30 0 - 44 U/L   Alkaline Phosphatase 52 38 - 126 U/L   Total Bilirubin 1.2 0.3 - 1.2 mg/dL   GFR, Estimated >60 >60 mL/min    Comment: (NOTE) Calculated using the CKD-EPI Creatinine Equation (2021)    Anion gap 9 5 - 15    Comment: Performed at [facility], [place] [address], [place], [place] [address]   Blood Alcohol level:  Lab Results  Component Value Date   ETH 82 (H) 40/81/4481   Metabolic Disorder Labs: Lab Results  Component Value Date   HGBA1C 5.4 05/05/2021   MPG 108.28 05/05/2021   No results  found for: PROLACTIN Lab Results  Component Value Date   CHOL 176 05/05/2021   TRIG 582 (H) 05/05/2021   HDL 62 05/05/2021   CHOLHDL 2.8 05/05/2021   VLDL UNABLE TO CALCULATE IF TRIGLYCERIDE OVER 400 mg/dL 05/05/2021   LDLCALC UNABLE TO CALCULATE IF TRIGLYCERIDE OVER 400 mg/dL 05/05/2021   Physical Findings: AIMS: Facial and Oral Movements Muscles of Facial Expression: None, normal Lips and Perioral Area: None, normal Jaw: None, normal Tongue: None, normal,Extremity  Movements Upper (arms, wrists, hands, fingers): None, normal Lower (legs, knees, ankles, toes): None, normal, Trunk Movements Neck, shoulders, hips: None, normal, Overall Severity Severity of abnormal movements (highest score from questions above): None, normal Incapacitation due to abnormal movements: None, normal Patient's awareness of abnormal movements (rate only patient's report): No Awareness, Dental Status Current problems with teeth and/or dentures?: Yes Does patient usually wear dentures?: No  CIWA:  CIWA-Ar Total: 1 COWS:  COWS Total Score: 1  Musculoskeletal: Strength & Muscle Tone: within normal limits Gait & Station: normal Patient leans: N/A  Psychiatric Specialty Exam:  Presentation  General Appearance: Appropriate for Environment; Casual  Eye Contact:Good  Speech:Clear and Coherent; Normal Rate  Speech Volume:Normal  Handedness:No data recorded  Mood and Affect  Mood:Euthymic  Affect:Full Range; Other (comment) (Inappropriately bright given the situation)  Thought Process  Thought Processes:Coherent; Goal Directed  Descriptions of Associations:Intact  Orientation:Full (Time, Place and Person)  Thought Content:Paranoid Ideation  History of Schizophrenia/Schizoaffective disorder:No  Duration of Psychotic Symptoms:Less than six months  Hallucinations:Hallucinations: None  Ideas of Reference:None  Suicidal Thoughts:Suicidal Thoughts: No  Homicidal Thoughts:Homicidal Thoughts: No  Sensorium  Memory:Immediate Good; Recent Poor; Remote Good  Judgment:Fair  Insight:Lacking  Executive Functions  Concentration:Good  Attention Span:Good  [place] of Knowledge:Good  Language:Good  Psychomotor Activity  Psychomotor Activity:Psychomotor Activity: Normal  Assets  Assets:Communication Skills; Housing; Physical Health; Resilience; Social Support  Sleep  Sleep:Sleep: Good (Number of hours not recorded)  Physical Exam: Physical  Exam Vitals and nursing note reviewed.  HENT:     Head: Normocephalic and atraumatic.     Nose: Nose normal.  Eyes:     Pupils: Pupils are equal, round, and reactive to light.  Cardiovascular:     Rate and Rhythm: Normal rate.  Pulmonary:     Effort: Pulmonary effort is normal.  Genitourinary:    Comments: Deferred Musculoskeletal:        General: Normal range of motion.     Cervical back: Normal range of motion.  Skin:    General: Skin is warm and dry.  Neurological:     General: No focal deficit present.     Mental Status: He is alert and oriented to person, place, and time.    Review of Systems  Constitutional: Negative.   HENT: Negative.   Eyes: Negative.   Respiratory: Negative.   Cardiovascular: Negative.   Gastrointestinal: Negative.   Genitourinary: Negative.   Musculoskeletal: Negative.   Skin: Negative.   Neurological: Negative.   Endo/Heme/Allergies: Negative.   Psychiatric/Behavioral: Positive for substance abuse (Hx. alcohol & THC use disorder). Negative for depression, hallucinations, memory loss and suicidal ideas. The patient is not nervous/anxious and does not have insomnia.    Blood pressure 117/82, pulse (!) 111, temperature (!) 97.4 F (36.3 C), temperature source Oral, resp. rate 18, height _0  (1.854 m), weight 59 kg, SpO2 100 %. Body mass index is 17.15 kg/m.  Treatment Plan Summary: Daily contact with patient to assess and evaluate symptoms and progress  in treatment and Medication management.   Continue impatient hospitalization. Will continue today 05/08/2021 plan as below except where it is noted.  Continue on IVC status  Continue every 15-minute observation level  Encourage participation in group therapy and therapeutic milieu  Mood disorder -Continue olanzapine 7.5 mg at bedtime for mood stabilization and paranoia  Alcohol use disorder -Continue on CIWA protocol with PRN lorazepam, thiamine, multivitamin and comfort meds.  Patient  is not reporting symptoms or displaying signs of alcohol withdrawal.  Have discontinued standing dose lorazepam as patient was not taking and vital signs have remained stable since admission. -Patient would benefit from residential substance abuse treatment program participation after discharge if he is willing to participate  Anxiety -Continue hydroxyzine 25 mg 3 times daily PRN  Insomnia -Continue trazodone 50 mg at bedtime PRN  Low potassium -Give Klor-Con 20 mEq twice daily x 2 doses and repeat BMP on evening of 05/08/2021.  Disposition planning in progress.  Lindell Spar, NP, pmhnp, fnp-bc 05/08/2021, 3:08 PMPatient ID: Damon Olson, male   DOB: 06/26/1976, 45 y.o.   MRN: [id number]

## 2021-05-08 NOTE — Progress Notes (Signed)
Recreation Therapy Notes  Date: 5.25.22 Time: 0930 Location: [address] Dayroom  Group Topic: Stress Management   Goal Area(s) Addresses:  Patient will actively participate in stress management techniques presented during session.  Patient will successfully identify benefit of practicing stress management post d/c.   Behavioral Response: Appropriate  Intervention: Guided exercise with ambient sound and script  Activity :Guided Imagery  LRT read a script that focused enjoying the quiet calm of being at the beach listening to the waves.  Patients were to follow along as script was read and the sound of the waves playing in the background, patients were asked to visualize themselves in that space.  Patients were given suggestions of ways to access scripts post d/c and encouraged to explore Youtube and other apps available on smartphones, tablets, and computers.   Education:  Stress Management, Discharge Planning.   Education Outcome: Acknowledges education  Clinical Observations/Feedback: Patient actively engaged in technique introduced and  expressed no concerns.     Caroll Rancher, LRT/CTRS     Caroll Rancher A 05/08/2021 12:08 PM

## 2021-05-09 MED ORDER — RISPERIDONE 2 MG PO TBDP
2.0000 mg | ORAL_TABLET | Freq: Every evening | ORAL | Status: DC
  Administered 2021-05-09 – 2021-05-12 (×4): 2 mg via ORAL
  Filled 2021-05-09 (×5): qty 1
  Filled 2021-05-09: qty 7
  Filled 2021-05-09: qty 1

## 2021-05-09 MED ORDER — OLANZAPINE 10 MG PO TABS
10.0000 mg | ORAL_TABLET | Freq: Every day | ORAL | Status: DC
  Filled 2021-05-09: qty 1

## 2021-05-09 NOTE — Progress Notes (Signed)
   05/09/21 0100  Psych Admission Type (Psych Patients Only)  Admission Status Involuntary  Psychosocial Assessment  Patient Complaints Anxiety  Eye Contact Intense  Facial Expression Angry;Animated;Anxious  Affect Angry;Anxious;Apprehensive  Speech Aggressive;Logical/coherent  Interaction Avoidant;Cautious;Forwards little;Guarded;Minimal  Motor Activity Fidgety  Appearance/Hygiene Unremarkable  Behavior Characteristics Cooperative;Appropriate to situation  Mood Pleasant  Thought Process  Coherency Concrete thinking  Content Blaming others  Delusions Controlled;Persecutory  Perception WDL  Hallucination None reported or observed  Judgment Poor  Confusion None  Danger to Self  Current suicidal ideation? Denies  Danger to Others  Danger to Others None reported or observed
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
## 2021-05-09 NOTE — Progress Notes (Addendum)
   05/09/21 1200  Psych Admission Type (Psych Patients Only)  Admission Status Involuntary  Psychosocial Assessment  Patient Complaints None  Eye Contact Intense  Facial Expression Animated;Anxious  Affect Anxious  Speech Logical/coherent  Interaction Forwards little;Guarded  Motor Activity Fidgety  Appearance/Hygiene Unremarkable  Behavior Characteristics Cooperative;Anxious  Mood Anxious;Pleasant  Thought Process  Coherency Concrete thinking  Content Blaming others  Delusions Controlled;Persecutory  Perception WDL  Hallucination None reported or observed  Judgment Poor  Confusion None  Danger to Self  Current suicidal ideation? Denies  Danger to Others  Danger to Others None reported or observed    D. Pt presents with an anxious affect/mood- is friendly, but superficial during interactions. Per pt's self inventory, pt rated his depression, hopelessness and anxiety all 0's. Pt denies pain, withdrawal symptoms, SI/HI and A/VH. Pt observed in the dayroom interacting appropriately with peers and staff, and attending group. Pt reported that his goal today was "relaxing", and that he needs to "stay focused" to do that. A. Labs and vitals monitored. Pt supported emotionally and encouraged to express concerns and ask questions.   R. Pt remains safe with 15 minute checks. Will continue POC.

## 2021-05-10 ENCOUNTER — Inpatient Hospital Stay (HOSPITAL_COMMUNITY): Payer: Federal, State, Local not specified - Other

## 2021-05-10 NOTE — Progress Notes (Cosign Needed)
Adult Psychoeducational Group Note  Date:  05/10/2021 Time:  9:18 AM  Group Topic/Focus:  Goals Group:   The focus of this group is to help patients establish daily goals to achieve during treatment and discuss how the patient can incorporate goal setting into their daily lives to aide in recovery.  Participation Level:  Active  Participation Quality:  Appropriate  Affect:  Appropriate  Cognitive:  Alert  Insight: Appropriate  Engagement in Group:  Engaged  Modes of Intervention:  Discussion  Additional Comments:  Pt goal for today is to get back to work, pt seems in good spirits and is in a good mood.  Dillinger Aston R Warner [place] 05/10/2021, 9:18 AM

## 2021-05-10 NOTE — Progress Notes (Signed)
Patient was cooperative with treatment. No behavioral issues or w/d symptoms to report on shift. He is currently in bed resting quietly without incidence.

## 2021-05-10 NOTE — Progress Notes (Cosign Needed)
Adult Psychoeducational Group Note  Date:  05/10/2021 Time:  10:15 AM  Group Topic/Focus:  Managing Feelings:   The focus of this group is to identify what feelings patients have difficulty handling and develop a plan to handle them in a healthier way upon discharge.  Participation Level:  Active  Participation Quality:  Appropriate  Affect:  Appropriate  Cognitive:  Appropriate  Insight: Appropriate  Engagement in Group:  Engaged  Modes of Intervention:  Discussion  Additional Comments:  Pt attended group and participated in discussion.  Clydene Burack R Esmeralda Malay 05/10/2021, 10:15 AM

## 2021-05-10 NOTE — Progress Notes (Signed)
Recreation Therapy Notes  Date: 5.27.22 Time: 0930 Location: [address] Dayroom  Group Topic: Stress Management  Goal Area(s) Addresses:  Patient will identify positive stress management techniques. Patient will identify benefits of using stress management post d/c.  Behavioral Response: Engaged  Intervention: Stress Management  Activity: Meditation.  LRT played a meditation that focused on forgiving self.  Patients were to listen and follow along as the meditation played to fully engage in activity.    Education:  Stress Management, Discharge Planning.   Education Outcome: Acknowledges Education  Clinical Observations/Feedback: Pt attended and engaged in activity.  Pt had no questions or concerns.    Caroll Rancher, LRT/CTRS    Caroll Rancher A 05/10/2021 10:55 AM

## 2021-05-10 NOTE — [facility] Group Notes (Signed)
Adult Psychoeducational Group Note  Date:  05/10/2021 Time:  3:52 PM  Group Topic/Focus:  Emotional Education:   The focus of this group is to discuss what feelings/emotions are, and how they are experienced.  Participation Level:  Active  Participation Quality:  Appropriate and Attentive  Affect:  Appropriate  Cognitive:  Alert and Appropriate  Insight: Appropriate and Good  Engagement in Group:  Engaged  Modes of Intervention:  Discussion  Additional Comments:  Pt came to the emotion regulation group. Shared w/ the group his goal of focusing on the positives in his life.   Deforest Hoyles Kindel Rochefort 05/10/2021, 3:52 PM

## 2021-05-10 NOTE — Tx Team (Signed)
Interdisciplinary Treatment and Diagnostic Plan Update  05/10/2021 Time of Session: 9:05am Damon Olson MRN: [id number]  Principal Diagnosis: Unspecified mood (affective) disorder (HCC)  Secondary Diagnoses: Principal Problem:   Unspecified mood (affective) disorder (HCC) Active Problems:   Substance induced mood disorder (HCC)   Panic disorder   Marijuana use   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HCC)   Current Medications:  Current Facility-Administered Medications  Medication Dose Route Frequency Provider Last Rate Last Admin  . acetaminophen (TYLENOL) tablet 650 mg  650 mg Oral Q6H PRN Jaclyn Shaggy, PA-C      . alum & mag hydroxide-simeth (MAALOX/MYLANTA) 200-200-20 MG/5ML suspension 30 mL  30 mL Oral Q4H PRN Melbourne Abts W, PA-C      . feeding supplement (ENSURE ENLIVE / ENSURE PLUS) liquid 237 mL  237 mL Oral TID BM Antonieta Pert, MD   237 mL at 05/10/21 4627  . hydrOXYzine (ATARAX/VISTARIL) tablet 25 mg  25 mg Oral TID PRN Jaclyn Shaggy, PA-C      . OLANZapine zydis (ZYPREXA) disintegrating tablet 5 mg  5 mg Oral Q8H PRN Antonieta Pert, MD       And  . LORazepam (ATIVAN) tablet 1 mg  1 mg Oral Q6H PRN Antonieta Pert, MD       And  . ziprasidone (GEODON) injection 20 mg  20 mg Intramuscular Q6H PRN Antonieta Pert, MD      . magnesium hydroxide (MILK OF MAGNESIA) suspension 30 mL  30 mL Oral Daily PRN Jaclyn Shaggy, PA-C      . multivitamin with minerals tablet 1 tablet  1 tablet Oral Daily Antonieta Pert, MD   1 tablet at 05/10/21 [phone]  . risperiDONE (RISPERDAL M-TABS) disintegrating tablet 2 mg  2 mg Oral QPM Claudie Revering, MD   2 mg at 05/09/21 1900  . thiamine tablet 100 mg  100 mg Oral Daily Antonieta Pert, MD   100 mg at 05/10/21 0938  . traZODone (DESYREL) tablet 50 mg  50 mg Oral QHS PRN Jaclyn Shaggy, PA-C       PTA Medications: Medications Prior to Admission  Medication Sig Dispense Refill Last Dose  . ALPRAZolam (XANAX) 0.5 MG tablet  Take 0.5 mg by mouth 2 (two) times daily as needed for anxiety.   Past Month  . ascorbic acid (VITAMIN C) 100 MG tablet Take 100 mg by mouth daily.   05/05/2021  . citalopram (CELEXA) 20 MG tablet Take 20 mg by mouth daily.   05/06/2021  . Ergocalciferol (VITAMIN D2 PO) Take 1 tablet by mouth daily.   05/05/2021  . MILK THISTLE PO Take 1 capsule by mouth daily.   05/05/2021  . Multiple Vitamin (MULTIVITAMIN WITH MINERALS) TABS tablet Take 1 tablet by mouth daily.   05/05/2021  . Omega-3 1000 MG CAPS Take 1,000 mg by mouth daily.   05/05/2021    Patient Stressors: Marital or family conflict Occupational concerns Substance abuse  Patient Strengths: General fund of knowledge Motivation for treatment/growth Physical Health Supportive family/friends Work skills  Treatment Modalities: Medication Management, Group therapy, Case management,  1 to 1 session with clinician, Psychoeducation, Recreational therapy.   Physician Treatment Plan for Primary Diagnosis: Unspecified mood (affective) disorder (HCC) Long Term Goal(s): Improvement in symptoms so as ready for discharge Improvement in symptoms so as ready for discharge   Short Term Goals: Ability to identify changes in lifestyle to reduce recurrence of condition will improve Ability to verbalize  feelings will improve Ability to disclose and discuss suicidal ideas Ability to demonstrate self-control will improve Ability to identify and develop effective coping behaviors will improve Compliance with prescribed medications will improve Ability to identify triggers associated with substance abuse/mental health issues will improve Ability to identify changes in lifestyle to reduce recurrence of condition will improve Ability to verbalize feelings will improve Ability to disclose and discuss suicidal ideas Ability to demonstrate self-control will improve Ability to identify and develop effective coping behaviors will improve Compliance with  prescribed medications will improve Ability to identify triggers associated with substance abuse/mental health issues will improve  Medication Management: Evaluate patient's response, side effects, and tolerance of medication regimen.  Therapeutic Interventions: 1 to 1 sessions, Unit Group sessions and Medication administration.  Evaluation of Outcomes: Progressing  Physician Treatment Plan for Secondary Diagnosis: Principal Problem:   Unspecified mood (affective) disorder (HCC) Active Problems:   Substance induced mood disorder (HCC)   Panic disorder   Marijuana use   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HCC)  Long Term Goal(s): Improvement in symptoms so as ready for discharge Improvement in symptoms so as ready for discharge   Short Term Goals: Ability to identify changes in lifestyle to reduce recurrence of condition will improve Ability to verbalize feelings will improve Ability to disclose and discuss suicidal ideas Ability to demonstrate self-control will improve Ability to identify and develop effective coping behaviors will improve Compliance with prescribed medications will improve Ability to identify triggers associated with substance abuse/mental health issues will improve Ability to identify changes in lifestyle to reduce recurrence of condition will improve Ability to verbalize feelings will improve Ability to disclose and discuss suicidal ideas Ability to demonstrate self-control will improve Ability to identify and develop effective coping behaviors will improve Compliance with prescribed medications will improve Ability to identify triggers associated with substance abuse/mental health issues will improve     Medication Management: Evaluate patient's response, side effects, and tolerance of medication regimen.  Therapeutic Interventions: 1 to 1 sessions, Unit Group sessions and Medication administration.  Evaluation of Outcomes: Progressing   RN Treatment Plan for  Primary Diagnosis: Unspecified mood (affective) disorder (HCC) Long Term Goal(s): Knowledge of disease and therapeutic regimen to maintain health will improve  Short Term Goals: Ability to remain free from injury will improve, Ability to verbalize frustration and anger appropriately will improve, Ability to identify and develop effective coping behaviors will improve and Compliance with prescribed medications will improve  Medication Management: RN will administer medications as ordered by provider, will assess and evaluate patient's response and provide education to patient for prescribed medication. RN will report any adverse and/or side effects to prescribing provider.  Therapeutic Interventions: 1 on 1 counseling sessions, Psychoeducation, Medication administration, Evaluate responses to treatment, Monitor vital signs and CBGs as ordered, Perform/monitor CIWA, COWS, AIMS and Fall Risk screenings as ordered, Perform wound care treatments as ordered.  Evaluation of Outcomes: Progressing   LCSW Treatment Plan for Primary Diagnosis: Unspecified mood (affective) disorder (HCC) Long Term Goal(s): Safe transition to appropriate next level of care at discharge, Engage patient in therapeutic group addressing interpersonal concerns.  Short Term Goals: Engage patient in aftercare planning with referrals and resources, Increase social support, Increase ability to appropriately verbalize feelings, Identify triggers associated with mental health/substance abuse issues and Increase skills for wellness and recovery  Therapeutic Interventions: Assess for all discharge needs, 1 to 1 time with Social worker, Explore available resources and support systems, Assess for adequacy in community support network, Educate  family and significant other(s) on suicide prevention, Complete Psychosocial Assessment, Interpersonal group therapy.  Evaluation of Outcomes: Progressing   Progress in Treatment: Attending groups:  Yes. Participating in groups: Yes. Taking medication as prescribed: Yes. Toleration medication: Yes. Family/Significant other contact made: Yes, individual(s) contacted:  wife Patient understands diagnosis: No. Discussing patient identified problems/goals with staff: Yes. Medical problems stabilized or resolved: Yes. Denies suicidal/homicidal ideation: Yes. Issues/concerns per patient self-inventory: No.   New problem(s) identified: No, Describe:  none  New Short Term/Long Term Goal(s): detox, medication management for mood stabilization; elimination of SI thoughts; development of comprehensive mental wellness/sobriety plan  Patient Goals: "To get rest and go home"   Discharge Plan or Barriers: Patient recently admitted. CSW will continue to follow and assess for appropriate referrals and possible discharge planning.    Reason for Continuation of Hospitalization: Homicidal ideation Medication stabilization Suicidal ideation  Estimated Length of Stay: 3-5 days  Attendees: Patient: Damon Olson 05/10/2021 10:26 AM  Physician: Clifton Custard, MD 05/10/2021 10:26 AM  Nursing:  05/10/2021 10:26 AM  RN Care Manager: 05/10/2021 10:26 AM  Social Worker: Ruthann Cancer, LCSW 05/10/2021 10:26 AM  Recreational Therapist:  05/10/2021 10:26 AM  Other:  05/10/2021 10:26 AM  Other:  05/10/2021 10:26 AM  Other: 05/10/2021 10:26 AM    Scribe for Treatment Team: Felizardo Hoffmann, LCSWA 05/10/2021 10:26 AM

## 2021-05-10 NOTE — Plan of Care (Signed)
  Problem: Education: Goal: Knowledge of disease or condition will improve Outcome: Progressing   Problem: Education: Goal: Understanding of discharge needs will improve Outcome: Progressing
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
## 2021-05-10 NOTE — Progress Notes (Signed)
Pt visible on the unit, pt stated he was doing fine    05/10/21 2000  Psych Admission Type (Psych Patients Only)  Admission Status Involuntary  Psychosocial Assessment  Patient Complaints None  Eye Contact Intense  Facial Expression Anxious  Affect Anxious  Speech Logical/coherent  Interaction Assertive  Motor Activity Fidgety  Appearance/Hygiene Unremarkable  Behavior Characteristics Cooperative  Mood Anxious  Thought Process  Coherency Concrete thinking  Content Blaming others  Delusions WDL  Perception WDL  Hallucination None reported or observed  Judgment WDL  Confusion None  Danger to Self  Current suicidal ideation? Denies  Danger to Others  Danger to Others None reported or observed
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
## 2021-05-10 NOTE — [facility] Group Notes (Signed)
Type of Therapy and Topic:  Group Therapy - Healthy vs Unhealthy Coping Skills  Participation Level:  Active   Description of Group The focus of this group was to determine what unhealthy coping techniques typically are used by group members and what healthy coping techniques would be helpful in coping with various problems. Patients were guided in becoming aware of the differences between healthy and unhealthy coping techniques. Patients were asked to identify 2-3 healthy coping skills they would like to learn to use more effectively.  Therapeutic Goals 1. Patients learned that coping is what human beings do all day long to deal with various situations in their lives 2. Patients defined and discussed healthy vs unhealthy coping techniques 3. Patients identified their preferred coping techniques and identified whether these were healthy or unhealthy 4. Patients determined 2-3 healthy coping skills they would like to become more familiar with and use more often. 5. Patients provided support and ideas to each other   Summary of Patient Progress:  During group, Damon Olson expressed that he uses virtual reality goggle to go on virtual vacations. Patient proved open to input from peers and feedback from CSW. Patient demonstrated insight into the subject matter, was respectful of peers, and participated throughout the entire session.  Patient also accepted the worksheets and followed along with those worksheets during the discussion.

## 2021-05-11 DIAGNOSIS — F39 Unspecified mood [affective] disorder: Secondary | ICD-10-CM | POA: Diagnosis not present

## 2021-05-11 MED ORDER — DOCUSATE SODIUM 100 MG PO CAPS
100.0000 mg | ORAL_CAPSULE | Freq: Every day | ORAL | Status: DC | PRN
  Administered 2021-05-11: 100 mg via ORAL
  Filled 2021-05-11: qty 1

## 2021-05-11 NOTE — Progress Notes (Signed)
Patient attend wrap up group AA. 
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
## 2021-05-11 NOTE — Progress Notes (Signed)
   05/11/21 2307  Psych Admission Type (Psych Patients Only)  Admission Status Involuntary  Psychosocial Assessment  Patient Complaints Insomnia  Eye Contact Fair  Facial Expression Animated  Affect Appropriate to circumstance;Anxious  Speech Press photographer;Restless  Appearance/Hygiene Unremarkable  Behavior Characteristics Appropriate to situation  Mood Anxious;Pleasant  Thought Process  Coherency Concrete thinking  Content WDL  Delusions None reported or observed  Perception WDL  Hallucination None reported or observed  Judgment Impaired  Confusion None  Danger to Self  Current suicidal ideation? Denies  Danger to Others  Danger to Others None reported or observed

## 2021-05-11 NOTE — Progress Notes (Signed)
   05/11/21 0626  Vital Signs  Pulse Rate (!) 107  Pulse Rate Source Monitor  BP 100/83  BP Location Right Arm  BP Method Automatic  Patient Position (if appropriate) Standing  Oxygen Therapy  SpO2 100 %   D: Patient denies SI/HI/AVH Pt. Denies both anxiety and depression. Pt. Out in open areas and was social with peers. A:  Patient took scheduled medicine.  Support and encouragement provided Routine safety checks conducted every 15 minutes. Patient  Informed to notify staff with any concerns.   R:  Safety maintained.

## 2021-05-11 NOTE — [facility] Group Notes (Signed)
LCSW Group Therapy Note  05/11/2021   10:00-11:00am   Type of Therapy and Topic:  Group Therapy: Anger Cues and Responses  Participation Level:  Active   Description of Group:   In this group, patients learned how to recognize the physical, cognitive, emotional, and behavioral responses they have to anger-provoking situations.  They identified a recent time they became angry and how they reacted.  They analyzed how their reaction was possibly beneficial and how it was possibly unhelpful.  The group discussed a variety of healthier coping skills that could help with such a situation in the future.  Focus was placed on how helpful it is to recognize the underlying emotions to our anger, because working on those can lead to a more permanent solution as well as our ability to focus on the important rather than the urgent.  Therapeutic Goals: Patients will remember their last incident of anger and how they felt emotionally and physically, what their thoughts were at the time, and how they behaved. Patients will identify how their behavior at that time worked for them, as well as how it worked against them. Patients will explore possible new behaviors to use in future anger situations. Patients will learn that anger itself is normal and cannot be eliminated, and that healthier reactions can assist with resolving conflict rather than worsening situations.  Summary of Patient Progress:  The patient shared that his most recent time of anger was "recently" when his boss told him he had mixed some colors wrong in a development.  He asked his boss about how to make a better mix, and said this helped him, so he made a completely new mixture.  He spoke a lot in group, was optimistic but superficial in his enthusiasm.    Therapeutic Modalities:   Cognitive Behavioral Therapy  Lynnell Chad

## 2021-05-11 NOTE — Progress Notes (Signed)
The patient rated his day as a 10 out of 10 since he met a few of his new peers. His goal for tomorrow is to have another good day.

## 2021-05-11 NOTE — Progress Notes (Signed)
   05/11/21 0500  Sleep  Number of Hours 6.75   

## 2021-05-11 NOTE — Progress Notes (Signed)
   05/11/21 2304  COVID-19 Daily Checkoff  Have you had a fever (temp > 37.80C/100F)  in the past 24 hours?  No  If you have had runny nose, nasal congestion, sneezing in the past 24 hours, has it worsened? No  COVID-19 EXPOSURE  Have you traveled outside the state in the past 14 days? No  Have you been in contact with someone with a confirmed diagnosis of COVID-19 or PUI in the past 14 days without wearing appropriate PPE? No  Have you been living in the same home as a person with confirmed diagnosis of COVID-19 or a PUI (household contact)? No  Have you been diagnosed with COVID-19? No

## 2021-05-12 DIAGNOSIS — F39 Unspecified mood [affective] disorder: Secondary | ICD-10-CM | POA: Diagnosis not present

## 2021-05-12 NOTE — Progress Notes (Signed)
The focus of this group is to help patients review their daily goal of treatment and discuss progress on daily workbooks. Pt attended the evening group session and responded to all discussion prompts from the Writer. Pt shared that today was a good day on the unit, the highlight of which was eating good and catching up on rest.  Pt told that his goal for the coming week was to get discharged from the hospital, which he reported feeling well enough to do.  Pt rated his day a 10 out of 10 and his affect was appropriate.

## 2021-05-12 NOTE — Progress Notes (Signed)
   05/12/21 2352  Psych Admission Type (Psych Patients Only)  Admission Status Involuntary  Psychosocial Assessment  Patient Complaints None  Eye Contact Fair  Facial Expression Animated  Affect Appropriate to circumstance  Speech Logical/coherent  Interaction Assertive  Motor Activity Fidgety  Appearance/Hygiene Unremarkable  Behavior Characteristics Appropriate to situation  Mood Anxious  Thought Process  Coherency Concrete thinking  Content WDL  Delusions None reported or observed  Perception WDL  Hallucination None reported or observed  Judgment Impaired  Confusion None  Danger to Self  Current suicidal ideation? Denies  Danger to Others  Danger to Others None reported or observed

## 2021-05-12 NOTE — Progress Notes (Signed)
                   05/12/21 2346  COVID-19 Daily Checkoff  Have you had a fever (temp > 37.80C/100F)  in the past 24 hours?  No  If you have had runny nose, nasal congestion, sneezing in the past 24 hours, has it worsened? No  COVID-19 EXPOSURE  Have you traveled outside the state in the past 14 days? No  Have you been in contact with someone with a confirmed diagnosis of COVID-19 or PUI in the past 14 days without wearing appropriate PPE? No  Have you been living in the same home as a person with confirmed diagnosis of COVID-19 or a PUI (household contact)? No  Have you been diagnosed with COVID-19? No

## 2021-05-12 NOTE — [facility] Group Notes (Signed)
LCSW Group Therapy Note  05/12/2021       Type of Therapy and Topic:  Group Therapy: "My Mental Health"  Participation Level:  Active   Description of Group:   In this group, patients were asked four questions in order to generate discussion around the idea of mental illness and/or substance addiction being medical problems: In one sentence describe the current state of your mental health or substance use. How much do you feel similar to or different from others? Do you tend to identify with other people or compare yourself to them?  In a word or sentence, share what you desire your mental health or substance use to be moving forward.  Discussion was held that led to the conclusion that comparing ourselves to others is not healthy, but identifying with the elements of their issues that are similar to ours is helpful.    Therapeutic Goals: Patients will identify their feelings about their current mental health/substance use problems. Patients will describe how they feel similar to or different from others, and whether they tend to identify with or compare themselves to other people with the same issues. Patients will explore the differences in these concepts and how a change of mindset about mental health/substance use can help with reaching recovery goals. Patients will think about and share what their recovery goals are, in terms of mental health and/or substance use.  Summary of Patient Progress:  The patient shared that he feels the state of his mental health currently is "stable" and that is how he wants to feel.  Therapeutic Modalities:   Processing Motivational Interviewing Psychoeducation  Lynnell Chad, MSW, LCSW

## 2021-05-12 NOTE — Progress Notes (Signed)
D:  Patient's self inventory sheet, patient sleeps good, sleep medication helpful.  Good appetite, normal energy level, good concentration.  Denied hopeless, anxiety, depression.  Denied withdrawals.  Denied SI.  Denied physical problems.  Denied physical pain.  No pain medicine.  Goal is enjoy my last bit of time with all you.  "Thank you for for all the support and help.  It's been my pleasure to have the time with you all."  Does have discharge plans. A:  Medications administered per MD orders.  Emotional support and encouragement given patient. R:  Denied SI, contracts for safety.  Denied HI.  Denied A/v hallucinations.  Safety maintained with 15 minute checks.
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
## 2021-05-12 NOTE — Plan of Care (Signed)
Nurse discussed coping skills with patient.  

## 2021-05-13 MED ORDER — RISPERIDONE 2 MG PO TBDP: 2.0000 mg | ORAL_TABLET | Freq: Every evening | ORAL | 0 refills | Status: DC

## 2021-05-13 MED ORDER — TRAZODONE HCL 50 MG PO TABS: 50.0000 mg | ORAL_TABLET | Freq: Every evening | ORAL | 0 refills | Status: DC | PRN

## 2021-05-13 NOTE — BHH Suicide Risk Assessment (Signed)
[facility] Discharge Suicide Risk Assessment   Principal Problem: Unspecified mood (affective) disorder (HCC) Discharge Diagnoses: Principal Problem:   Unspecified mood (affective) disorder (HCC) Active Problems:   Substance induced mood disorder (HCC)   Panic disorder   Marijuana use   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HCC)   Total Time spent with patient: 15 minutes  Musculoskeletal: Strength & Muscle Tone: within normal limits Gait & Station: normal Patient leans: N/A  Psychiatric Specialty Exam  Presentation  General Appearance: Appropriate for Environment; Neat  Eye Contact:Good  Speech:Clear and Coherent; Normal Rate  Speech Volume:Normal  Handedness:No data recorded  Mood and Affect  Mood:Euthymic  Duration of Depression Symptoms: Greater than two weeks  Affect:Full Range   Thought Process  Thought Processes:Coherent; Goal Directed; Linear  Descriptions of Associations:Intact  Orientation:Full (Time, Place and Person)  Thought Content:Logical  History of Schizophrenia/Schizoaffective disorder:No  Duration of Psychotic Symptoms:Less than six months  Hallucinations:Hallucinations: None  Ideas of Reference:None  Suicidal Thoughts:Suicidal Thoughts: No  Homicidal Thoughts:Homicidal Thoughts: No   Sensorium  Memory:Immediate Good; Recent Fair; Remote Good  Judgment:Fair  Insight:Fair   Executive Functions  Concentration:Good  Attention Span:Good  Recall:Good  Fund of Knowledge:Good  Language:Good   Psychomotor Activity  Psychomotor Activity:Psychomotor Activity: Normal   Assets  Assets:Communication Skills; Desire for Improvement; Housing; Physical Health; Resilience; Social Support   Sleep  Sleep:Sleep: Good Number of Hours of Sleep: 5.5   Physical Exam: Physical Exam Vitals and nursing note reviewed.  Constitutional:      General: He is not in acute distress. HENT:     Head: Normocephalic.  Pulmonary:     Effort:  Pulmonary effort is normal.  Neurological:     General: No focal deficit present.     Mental Status: He is alert and oriented to person, place, and time.    Review of Systems  Constitutional: Negative.   Respiratory: Negative.   Cardiovascular: Negative.   Gastrointestinal: Negative.   Musculoskeletal: Negative.        Positive for pain of right thumb, index finger, hand and wrist  Neurological: Negative.   Psychiatric/Behavioral: Negative.  Negative for depression, hallucinations and suicidal ideas. The patient is not nervous/anxious and does not have insomnia.    Blood pressure 111/89, pulse 84, temperature 97.8 F (36.6 C), temperature source Oral, resp. rate 16, height 6\' 1"  (1.854 m), weight 59 kg, SpO2 100 %. Body mass index is 17.15 kg/m.  Mental Status Per Nursing Assessment::   On Admission:  NA  Demographic Factors:  Male and Low socioeconomic status  Loss Factors: Financial problems/change in socioeconomic status and Relationship stressors  Historical Factors: Impulsivity  Risk Reduction Factors:   Responsible for children under 40 years of age, Sense of responsibility to family, Living with another person, especially a relative and Positive social support  Continued Clinical Symptoms:  Mood lability - resolved Alcohol/Substance Abuse/Dependencies  Cognitive Features That Contribute To Risk:  None    Suicide Risk:  Minimal: No identifiable suicidal ideation.  Patients presenting with no risk factors but with morbid ruminations; may be classified as minimal risk based on the severity of the depressive symptoms   Follow-up Information    [facility]. Go on 05/21/2021.   Specialty: Behavioral Health Why: You have an appointment to start the substance abuse intensive outpatient program on 05/21/21 at 1:00 pm.  This program includes medication management services.  The appointments will be held in person. Contact information: [address]  [address] [place]  [address] [phone]              Plan Of Care/Follow-up recommendations:  Activity:  As tolerated  Tests:  You will need to have blood drawn periodically for lab work to monitor your cholesterol and blood sugar levels while you take risperidone.  Your outpatient doctor will tell you when blood needs to be drawn for lab work.  Other:   -Take medications as prescribed.   -Do not drink alcohol.  Do not use marijuana or other drugs.   -Attend outpatient substance abuse treatment program.   -Keep outpatient mental health follow-up appointments with therapist and psychiatrist.   -See your primary care provider regarding treatment of any medical conditions.  Claudie Revering, MD 05/13/2021, 10:33 AM

## 2021-05-13 NOTE — Progress Notes (Signed)
Recreation Therapy Notes  Date:  5.30.22 Time: 0930 Location: [address] Day[address]  Group Topic: Stress Management  Goal Area(s) Addresses:  Patient will identify positive stress management techniques. Patient will identify benefits of using stress management post d/c.  Behavioral Response: Engaged  Intervention: Stress Management  Activity :  Meditation.  LRT played a meditation that focused on being persistent in not only meditation but in life as well.  Patients were to listen and follow along as meditation played to fully engage in activity.  Education:  Stress Management, Discharge Planning.   Education Outcome: Acknowledges Education  Clinical Observations/Feedback: Pt attended and participated.  Pt expressed no concerns or had any questions.    Caroll Rancher, LRT/CTRS    Caroll Rancher A 05/13/2021 11:53 AM

## 2021-05-13 NOTE — Progress Notes (Signed)
Adult Psychoeducational Group Note  Date:  05/13/2021 Time:  8:49 AM  Group Topic/Focus:  Goals Group:   The focus of this group is to help patients establish daily goals to achieve during treatment and discuss how the patient can incorporate goal setting into their daily lives to aide in recovery.  Participation Level:  Active  Participation Quality:  Appropriate  Affect:  Appropriate  Cognitive:  Alert  Insight: Appropriate  Engagement in Group:  Engaged  Modes of Intervention:  Discussion  Additional Comments:  Pt goal for the day is to be discharged and get back to work, pt seems in a great mood.  [name] [name] 05/13/2021, 8:49 AM

## 2021-05-13 NOTE — Progress Notes (Signed)
  [facility] Adult Case Management Discharge Plan :  Will you be returning to the same living situation after discharge:  Yes,  personal home At discharge, do you have transportation home?: Yes,  wife Do you have the ability to pay for your medications: Yes,  has income  Release of information consent forms completed and in the chart;  Patient's signature needed at discharge.  Patient to Follow up at:  Follow-up Information    [facility]. Go on 05/21/2021.   Specialty: Behavioral Health Why: You have an appointment to start the substance abuse intensive outpatient program on 05/21/21 at 1:00 pm.  This program includes medication management services.  The appointments will be held in person. Contact information: [address] [place] [address] [phone]              Next level of care provider has access to [facility] Link:yes  Safety Planning and Suicide Prevention discussed: Yes,  w/ wife  Have you used any form of tobacco in the last 30 days? (Cigarettes, Smokeless Tobacco, Cigars, and/or Pipes): Yes  Has patient been referred to the Quitline?: Patient refused referral  Patient has been referred for addiction treatment: Yes  Felizardo Hoffmann, LCSWA 05/13/2021, 10:23 AM

## 2021-05-13 NOTE — Discharge Summary (Signed)
Physician Discharge Summary Note  Patient:  Damon Olson is an 45 y.o., male MRN:  [id number] DOB:  June 02, 1976 Patient phone:  [phone] (home)  Patient address:   [address] [place] [facility] [place] [address],  Total Time spent with patient: 30 minutes  Date of Admission:  05/06/2021 Date of Discharge: 05/13/2021  Reason for Admission: (From MD's admission note): Damon Olson a 45 year old male with a history of anxiety and panic attacks well as alcohol and cannabis use who was admitted on IVC takenout by his wife for reportedly threatening to kill everyone in his house and threatening to burn the house down. The patient was transported to [facility] ED by [facility].ED notes document that patient had aloadedrifle and barricaded himself inside the house. Chart notes indicate that police reported being called to the home for a domestic dispute and were told that the patient had a large knife and told a live-in roommate that he was going to kill himself and everyone else in the house. Reportedly the patient was cooperative with police. On arrival to the ED, patient denied SI, HI or other symptoms. He adamantly denied that he had barricaded himself inside the house or made threats to harm himself or anyone else. Collateral information obtained in the ED from patient's ex-partner indicatedthat she ended their relationship 2 weeks ago due to patient's refusal to get help for hisdrinking problem. She also reported that patient had made paranoid statements about the roommate as well as about his brothers. In the ED, BAL was 82 mg/dL and urine drug screen was positive for THC. The patient was transferred to [facility] further assessment and treatment of his psychiatric symptoms and for safety of patient and others.   During my interview, the patient adamantly denies any desire to harm others and denies making any threats to harm others. He does acknowledge that  during a conversation with the roommate he stated that "if anything happened to my girls Iamgoing to burnthisdown."Patient explains he was merely talking and saying that he would do anything to protect his daughters. He denies that he barricaded himself in a room or in the home. He reports he typically keeps the front door locked and it was locked when police arrived but he opened it for them. He denies that he had barricaded himself in a room with guns. He does acknowledge that he owns multiple guns which he states are for protection. Patient states that he himself called the police because he haswanted the roommate (whom patient describes as "a felon") to move out for the last month. Patient reports that the day of the police call had otherwise been a normal day and that there was no dispute in the house prior to the police arriving. He denies conflict with his partner, roommate, neighbors or anyone else. He was barbecuing that afternoon and had a few drinks prior to the police arriving. The patient describes his mood as "good" as it always is. He reports that he always has high energy. Patient states that he was sleeping well (from 8 to 10 PM until 6 AM) during the weeks prior to admission. He denies sad or depressed mood or anhedonia. He denies racing thoughts or mood swings. He denies trouble concentrating. Patient denies passive wish for death, SI, AI, HI, PI. 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 He has taken Celexa on and off for the last 5 to 7 years.The patient experiences panic attacks consist of feeling very anxious like something bad will happen, numbness of extremities, racing heart, tingling, hyperventilation, feeling hot and  sweating. Attacks tend to last from 10 minutes to 3 days and occur approximately 3-4 times per week without identifiable precipitant.  The patient reports that he drinks socially and estimates 3-4 drinks per occasion approximately once per week. In the ED he reported alcohol use approximately 3 times per week of 1.5 shots per occasion. Collateral information obtained in the ED from patient's ex partner indicates that patient drinks daily.  Patient reports marijuana use of 1-2 joints several times a week to reduce his anxiety. The patient denies other drug use. He smokes cigarettes one half a pack per day.  Evaluation on the unit: Patient is seen and evaluated. He reported he is sleeping and eating well. He is taking his medications as prescribed and has no issues with them. He denies SI/HI/AVH, paranoia and delusions. He is calm and cooperative and interacts appropriately with staff and peers. He has follow up appointments as listed in his discharge paperwork. He agrees to continue taking his medications and go to his follow up appointments after he is discharged. Patient is stable for discharge home today.   Principal Problem: Unspecified mood (affective) disorder (HCC) Discharge Diagnoses: Principal Problem:   Unspecified mood (affective) disorder (HCC) Active Problems:   Substance induced mood disorder (HCC)   Panic disorder   Marijuana use   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HCC)   Past Psychiatric History: See H&P  Past Medical History:  Past Medical History:  Diagnosis Date  . Anxiety   . Depression   . Marijuana use 05/06/2021  . 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HCC) 05/06/2021  . Panic disorder 05/06/2021  . Unspecified mood (affective) disorder (HCC) 05/06/2021   History reviewed. No pertinent surgical history. Family History: History reviewed. No pertinent family history. Family Psychiatric  History: See H&P Social History:  Social History   Substance and Sexual Activity   Alcohol Use Yes   Comment: 3-4 glasses wine/beer every other day     Social History   Substance and Sexual Activity  Drug Use No    Social History   Socioeconomic History  . Marital status: Significant Other    Spouse name: Not on file  . Number of children: Not on file  . Years of education: Not on file  . Highest education level: Not on file  Occupational History  . Not on file  Tobacco Use  . Smoking status: Current Every Day Smoker    Packs/day: 1.00    Years: 15.00    Pack years: 15.00    Types: Cigarettes  . Smokeless tobacco: Never Used  Substance and Sexual Activity  . Alcohol use: Yes    Comment: 3-4 glasses wine/beer every other day  . Drug use: No  . Sexual activity: Not on file  Other Topics Concern  . Not on file  Social History Narrative  . Not on file   Social Determinants of Health   Financial Resource Strain: Not on file  Food Insecurity: Not on file  Transportation Needs: Not on file  Physical Activity: Not on file  Stress: Not on file  Social Connections: Not on file    Hospital Course:  After the above admission evaluation, Adien's  presenting symptoms were noted. He was recommended for mood stabilization treatments. The medication regimen targeting those presenting symptoms were discussed with him & initiated with his consent. He was started on Risperdal for his mood and Trazodone PRN for sleep. His UDS on arrival to the ED was positive for THC, BAL was 82,  however, he did not develop any alcohol withdrawal symptoms & did not require PRN Ativan for alcohol withdrawal.  He was however medicated, stabilized & discharged on the medications as listed on his discharge medication list below. Besides the mood stabilization treatments, Arlys JohnBrian was also enrolled & participated in the group counseling sessions being offered & held on this unit. He learned coping skills. He presented no other significant pre-existing medical issues that required treatment. He  tolerated his treatment regimen without any adverse effects or reactions reported.   During the course of his hospitalization, the 15-minute checks were adequate to ensure patient's safety. Arlys JohnBrian did not display any dangerous, violent or suicidal behavior on the unit.  He interacted with patients & staff appropriately, participated appropriately in the group sessions/therapies. His medications were addressed & adjusted to meet his needs. He was recommended for outpatient follow-up care & medication management upon discharge to assure continuity of care & mood stability.  At the time of discharge patient is not reporting any acute suicidal/homicidal ideations. He feels more confident about his self-care & in managing his mental health. He currently denies any new issues or concerns. Education and supportive counseling provided throughout his hospital stay & upon discharge.   Today upon his discharge evaluation with the attending psychiatrist, Arlys JohnBrian shares he is doing well. He denies any other specific concerns. He is sleeping well. His appetite is good. He denies other physical complaints. He denies AH/VH, delusional thoughts or paranoia. He does not appear to be responding to any internal stimuli. He feels that his medications have been helpful & is in agreement to continue his current treatment regimen as recommended. He was able to engage in safety planning including [facility] or contact emergency services if he feels unable to maintain his own safety or the safety of others. Pt had no further questions, comments, or concerns. [facility] with [facility] personal belongings in no apparent distress. Transportation per private vehicle with his wife.   Physical Findings: AIMS: Facial and Oral Movements Muscles of Facial Expression: None, normal Lips and Perioral Area: None, normal Jaw: None, normal Tongue: None, normal,Extremity Movements Upper (arms, wrists, hands, fingers): None, normal Lower  (legs, knees, ankles, toes): None, normal, Trunk Movements Neck, shoulders, hips: None, normal, Overall Severity Severity of abnormal movements (highest score from questions above): None, normal Incapacitation due to abnormal movements: None, normal Patient's awareness of abnormal movements (rate only patient's report): No Awareness, Dental Status Current problems with teeth and/or dentures?: Yes Does patient usually wear dentures?: No  CIWA:  CIWA-Ar Total: 5 COWS:  COWS Total Score: 1  Musculoskeletal: Strength & Muscle Tone: within normal limits Gait & Station: normal Patient leans: N/A  Psychiatric Specialty Exam:  Presentation  General Appearance: Appropriate for Environment; Casual; Fairly Groomed  Eye Contact:Good  Speech:Clear and Coherent; Normal Rate  Speech Volume:Normal  Handedness:Right  Mood and Affect  Mood:Euthymic  Affect:Congruent  Thought Process  Thought Processes:Coherent; Goal Directed  Descriptions of Associations:Intact  Orientation:Full (Time, Place and Person)  Thought Content:Logical  History of Schizophrenia/Schizoaffective disorder:No  Duration of Psychotic Symptoms:Less than six months  Hallucinations:Hallucinations: None  Ideas of Reference:None  Suicidal  Thoughts:Suicidal Thoughts: No  Homicidal Thoughts:Homicidal Thoughts: No  Sensorium  Memory:Immediate Good; Recent Good; Remote Good  Judgment:Fair  Insight:Fair  Executive Functions  Concentration:Good  Attention Span:Good  Recall:Good  Fund of Knowledge:Good  Language:Good  Psychomotor Activity  Psychomotor Activity:Psychomotor Activity: Normal  Assets  Assets:Communication Skills; Desire for Improvement; Financial Resources/Insurance; Housing; Resilience; Social Support  Sleep  Sleep:Sleep: Good Number of Hours of Sleep: 5.5  Physical Exam: Physical Exam Vitals and nursing note reviewed.  Constitutional:      Appearance: Normal appearance.   HENT:     Head: Normocephalic.  Pulmonary:     Effort: Pulmonary effort is normal.  Musculoskeletal:        General: Normal range of motion.     Cervical back: Normal range of motion.  Neurological:     General: No focal deficit present.     Mental Status: He is alert and oriented to person, place, and time.  Psychiatric:        Attention and Perception: Attention normal. He does not perceive auditory or visual hallucinations.        Mood and Affect: Mood normal.        Speech: Speech normal.        Behavior: Behavior normal. Behavior is cooperative.        Thought Content: Thought content normal.        Cognition and Memory: Cognition normal.    Review of Systems  Constitutional: Negative for fever.  HENT: Negative for congestion and sore throat.   Respiratory: Negative for cough, shortness of breath and wheezing.   Cardiovascular: Negative for chest pain.  Gastrointestinal: Negative.   Genitourinary: Negative.   Musculoskeletal: Negative.   Neurological: Negative.    Blood pressure 111/89, pulse 84, temperature 97.8 F (36.6 C), temperature source Oral, resp. rate 16, height  (1.854 m), weight 59 kg, SpO2 100 %. Body mass index is 17.15 kg/m.   Have you used any form of tobacco in the last 30 days? (Cigarettes, Smokeless Tobacco, Cigars, and/or Pipes): Yes  Has this patient used any form of tobacco in the last 30 days? (Cigarettes, Smokeless Tobacco, Cigars, and/or Pipes) Yes, N/A  Blood Alcohol level:  Lab Results  Component Value Date   ETH 82 (H) 05/05/2021    Metabolic Disorder Labs:  Lab Results  Component Value Date   HGBA1C 5.4 05/05/2021   MPG 108.28 05/05/2021   No results found for: PROLACTIN Lab Results  Component Value Date   CHOL 176 05/05/2021   TRIG 582 (H) 05/05/2021   HDL 62 05/05/2021   CHOLHDL 2.8 05/05/2021   VLDL UNABLE TO CALCULATE IF TRIGLYCERIDE OVER 400 mg/dL 78/29/5621   LDLCALC UNABLE TO CALCULATE IF TRIGLYCERIDE OVER 400  mg/dL 30/86/5784    See Psychiatric Specialty Exam and Suicide Risk Assessment completed by Attending Physician prior to discharge.  Discharge destination:  Home  Is patient on multiple antipsychotic therapies at discharge:  No   Has Patient had three or more failed trials of antipsychotic monotherapy by history:  No  Recommended Plan for Multiple Antipsychotic Therapies: NA  Discharge Instructions    Diet - low sodium heart healthy   Complete by: As directed    Increase activity slowly   Complete by: As directed      Allergies as of 05/13/2021   No Known Allergies     Medication List    STOP taking these medications   ALPRAZolam 0.5 MG tablet Commonly known as: Prudy Feeler  citalopram 20 MG tablet Commonly known as: CELEXA   MILK THISTLE PO     TAKE these medications     Indication  ascorbic acid 100 MG tablet Commonly known as: VITAMIN C Take 100 mg by mouth daily.  Indication: Inadequate Vitamin C   multivitamin with minerals Tabs tablet Take 1 tablet by mouth daily.  Indication: supplement   Omega-3 1000 MG Caps Take 1,000 mg by mouth daily.  Indication: supplement   risperiDONE 2 MG disintegrating tablet Commonly known as: RISPERDAL M-TABS Take 1 tablet (2 mg total) by mouth every evening.  Indication: Major Depressive Disorder   traZODone 50 MG tablet Commonly known as: DESYREL Take 1 tablet (50 mg total) by mouth at bedtime as needed for sleep.  Indication: Trouble Sleeping   VITAMIN D2 PO Take 1 tablet by mouth daily.  Indication: supplement       Follow-up Information    [facility]. Go on 05/21/2021.   Specialty: Behavioral Health Why: You have an appointment to start the substance abuse intensive outpatient program on 05/21/21 at 1:00 pm.  This program includes medication management services.  The appointments will be held in person. Contact information: [address] [place] [address] [phone]               Follow-up recommendations:  Activity:  as tolerated Diet:  Heart healthy  Comments:  Paper prescriptions were given at discharge.  Patient is agreeable with the discharge plan.  He was given opportunity to ask questions.  He appears to feel comfortable with discharge and denies any current suicidal or homicidal thoughts.   Patient is instructed prior to discharge to: Take all medications as prescribed by his mental healthcare provider. Report any adverse effects and or reactions from the medicines to his outpatient provider promptly. Patient has been instructed & cautioned: To not engage in alcohol and or illegal drug use while on prescription medicines. In the event of worsening symptoms, patient is instructed to call the crisis hotline, 911 and or go to the nearest ED for appropriate evaluation and treatment of symptoms. To follow-up with his primary care provider for your other medical issues, concerns and or health care needs.  Signed: Laveda Abbe, NP 05/13/2021, 11:05 AM

## 2021-05-13 NOTE — Progress Notes (Signed)
Nursing discharge note: Patient discharged home per MD order.  Patient received all personal belongings from unit and locker.  Reviewed AVS/transition record with patient and he indicates understanding.  Patient will follow up with outpatient provicer.  Patient denies any thoughts of self harm.  He left ambulatory with his wife.

## 2021-05-21 ENCOUNTER — Ambulatory Visit (HOSPITAL_COMMUNITY): Payer: Federal, State, Local not specified - Other | Admitting: Behavioral Health

## 2021-06-06 ENCOUNTER — Other Ambulatory Visit: Payer: Self-pay

## 2021-06-06 ENCOUNTER — Ambulatory Visit (INDEPENDENT_AMBULATORY_CARE_PROVIDER_SITE_OTHER): Payer: No Payment, Other | Admitting: Behavioral Health

## 2021-06-06 DIAGNOSIS — F39 Unspecified mood [affective] disorder: Secondary | ICD-10-CM

## 2021-06-27 NOTE — Progress Notes (Signed)
Damon Olson is a 45 y.o. male client who presented to [facility] for a hospital discharge follow-up appointment. Per client report and chart review, pt was admitted for psychiatric inpatient treatment on 05/06/2021 and discharged on 05/13/2021. Clt reports he was referred for [facility] services; however, clt did not appear to want SAIOP services at this time. He requested if he could return at a later date (06/24/2021) to start SAIOP.  Clt was scheduled a follow-up individual therapy session and a medication management appointment.  Clt was alert and oriented x5. Clt denied SI/HI/AVH.        Mamie Nick, Counselor

## 2021-07-01 ENCOUNTER — Telehealth (INDEPENDENT_AMBULATORY_CARE_PROVIDER_SITE_OTHER): Payer: No Payment, Other | Admitting: Psychiatry

## 2021-07-01 ENCOUNTER — Encounter (HOSPITAL_COMMUNITY): Payer: Self-pay | Admitting: Psychiatry

## 2021-07-01 ENCOUNTER — Other Ambulatory Visit: Payer: Self-pay

## 2021-07-01 DIAGNOSIS — F330 Major depressive disorder, recurrent, mild: Secondary | ICD-10-CM | POA: Diagnosis not present

## 2021-07-01 DIAGNOSIS — F411 Generalized anxiety disorder: Secondary | ICD-10-CM | POA: Insufficient documentation

## 2021-07-01 MED ORDER — TRAZODONE HCL 50 MG PO TABS
50.0000 mg | ORAL_TABLET | Freq: Every evening | ORAL | 2 refills | Status: DC | PRN
  Filled 2021-07-01 – 2021-07-08 (×2): qty 30, 30d supply, fill #0

## 2021-07-01 MED ORDER — CITALOPRAM HYDROBROMIDE 20 MG PO TABS
20.0000 mg | ORAL_TABLET | Freq: Every day | ORAL | 2 refills | Status: DC
  Filled 2021-07-01 – 2021-07-08 (×2): qty 30, 30d supply, fill #0
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
## 2021-07-08 ENCOUNTER — Other Ambulatory Visit: Payer: Self-pay

## 2021-07-18 ENCOUNTER — Ambulatory Visit (HOSPITAL_COMMUNITY): Payer: No Payment, Other | Admitting: Clinical

## 2021-08-07 ENCOUNTER — Telehealth (HOSPITAL_COMMUNITY): Payer: Self-pay | Admitting: *Deleted

## 2021-08-07 NOTE — Telephone Encounter (Signed)
Call from patient wanting to speak with his provider who is Toy Cookey DNP. He states he was "sketchy about taking Risperdal" and has some questions for her. He would like her to call him back. Will forward his concern to her.

## 2021-08-08 ENCOUNTER — Other Ambulatory Visit (HOSPITAL_COMMUNITY): Payer: Self-pay | Admitting: Psychiatry

## 2021-08-08 ENCOUNTER — Other Ambulatory Visit: Payer: Self-pay

## 2021-08-08 MED ORDER — QUETIAPINE FUMARATE 25 MG PO TABS
25.0000 mg | ORAL_TABLET | Freq: Two times a day (BID) | ORAL | 2 refills | Status: DC | PRN
  Filled 2021-08-08: qty 60, 30d supply, fill #0

## 2021-08-08 NOTE — Telephone Encounter (Signed)
Patient inform writer that he has been more anxious.  He notes that he wakes up in the middle of the night with panic attacks.  He notes that he continues to deal with not seeing his kid and pending court cases.  He requested provider fill Xanax.  Provider informed patient that Xanax would not be filled at this time.  Provider recommended increasing his Celexa to 40 mg instead of 20 mg.  He notes that his PCP recently increased it to 40 mg.  He notes that he found other antidepressants and Risperdal ineffective due to side effects.  Provider recommended BuSpar.  Patient notes at this time he prefers something that may act a little quicker.  Provider informed patient that he can take Seroquel twice daily as needed.  He endorsed understanding and was agreeable. Potential side effects of medication and risks vs benefits of treatment vs non-treatment were explained and discussed. All questions were answered.  No other concerns noted at this time.

## 2021-08-15 ENCOUNTER — Other Ambulatory Visit: Payer: Self-pay

## 2021-09-30 ENCOUNTER — Other Ambulatory Visit: Payer: Self-pay

## 2021-09-30 ENCOUNTER — Encounter (HOSPITAL_COMMUNITY): Payer: Self-pay | Admitting: Psychiatry

## 2021-09-30 ENCOUNTER — Telehealth (INDEPENDENT_AMBULATORY_CARE_PROVIDER_SITE_OTHER): Payer: No Payment, Other | Admitting: Psychiatry

## 2021-09-30 DIAGNOSIS — F411 Generalized anxiety disorder: Secondary | ICD-10-CM

## 2021-09-30 DIAGNOSIS — F330 Major depressive disorder, recurrent, mild: Secondary | ICD-10-CM

## 2021-09-30 MED ORDER — TRAZODONE HCL 50 MG PO TABS
50.0000 mg | ORAL_TABLET | Freq: Every evening | ORAL | 3 refills | Status: DC | PRN
  Filled 2021-09-30: qty 30, 30d supply, fill #0

## 2021-09-30 MED ORDER — CITALOPRAM HYDROBROMIDE 20 MG PO TABS
20.0000 mg | ORAL_TABLET | Freq: Every day | ORAL | 3 refills | Status: DC
  Filled 2021-09-30: qty 30, 30d supply, fill #0

## 2021-09-30 MED ORDER — HYDROXYZINE HCL 10 MG PO TABS
10.0000 mg | ORAL_TABLET | Freq: Three times a day (TID) | ORAL | 3 refills | Status: DC | PRN
Start: 2021-09-30 — End: 2021-12-31
  Filled 2021-09-30: qty 90, 30d supply, fill #0
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
## 2021-10-07 ENCOUNTER — Other Ambulatory Visit: Payer: Self-pay

## 2021-11-26 ENCOUNTER — Ambulatory Visit (HOSPITAL_COMMUNITY): Payer: No Payment, Other | Admitting: Licensed Clinical Social Worker

## 2021-11-26 ENCOUNTER — Ambulatory Visit (HOSPITAL_COMMUNITY): Payer: No Payment, Other | Admitting: Clinical

## 2021-12-31 ENCOUNTER — Telehealth (INDEPENDENT_AMBULATORY_CARE_PROVIDER_SITE_OTHER): Payer: No Payment, Other | Admitting: Psychiatry

## 2021-12-31 ENCOUNTER — Encounter (HOSPITAL_COMMUNITY): Payer: Self-pay | Admitting: Psychiatry

## 2021-12-31 ENCOUNTER — Other Ambulatory Visit: Payer: Self-pay

## 2021-12-31 DIAGNOSIS — F411 Generalized anxiety disorder: Secondary | ICD-10-CM

## 2021-12-31 DIAGNOSIS — F330 Major depressive disorder, recurrent, mild: Secondary | ICD-10-CM | POA: Diagnosis not present

## 2021-12-31 MED ORDER — HYDROXYZINE HCL 10 MG PO TABS
10.0000 mg | ORAL_TABLET | Freq: Three times a day (TID) | ORAL | 3 refills | Status: DC | PRN
  Filled 2021-12-31: qty 90, 30d supply, fill #0

## 2021-12-31 MED ORDER — TRAZODONE HCL 50 MG PO TABS: 50.0000 mg | ORAL_TABLET | Freq: Every evening | ORAL | 3 refills | Status: DC | PRN

## 2021-12-31 MED ORDER — CITALOPRAM HYDROBROMIDE 20 MG PO TABS: 20.0000 mg | ORAL_TABLET | Freq: Every day | ORAL | 3 refills | Status: DC

## 2021-12-31 MED ORDER — CITALOPRAM HYDROBROMIDE 20 MG PO TABS
20.0000 mg | ORAL_TABLET | Freq: Every day | ORAL | 3 refills | Status: DC
  Filled 2021-12-31: qty 30, 30d supply, fill #0

## 2021-12-31 MED ORDER — TRAZODONE HCL 50 MG PO TABS
50.0000 mg | ORAL_TABLET | Freq: Every evening | ORAL | 3 refills | Status: DC | PRN
  Filled 2021-12-31: qty 30, 30d supply, fill #0

## 2021-12-31 MED ORDER — HYDROXYZINE HCL 10 MG PO TABS: 10.0000 mg | ORAL_TABLET | Freq: Three times a day (TID) | ORAL | 3 refills | Status: DC | PRN

## 2021-12-31 NOTE — Progress Notes (Signed)
BH MD/PA/NP OP Progress Note Virtual Visit via Telephone Note  I connected with Damon Olson on 12/31/21 at  3:00 PM EST by telephone and verified that I am speaking with the correct person using two identifiers.  Location: Patient: home Provider: Clinic   I discussed the limitations, risks, security and privacy concerns of performing an evaluation and management service by telephone and the availability of in person appointments. I also discussed with the patient that there may be a patient responsible charge related to this service. The patient expressed understanding and agreed to proceed.   I provided 30 minutes of non-face-to-face time during this encounter.  12/31/2021 12:24 PM Damon Olson  MRN:  [id number]  Chief Complaint: "Everything is fine"  HPI: 46 year old male seen today for followed psychiatric evaluation. He has a psychiatric history of substance induced mood disorder, anxiety, panic attacks, alcohol use  and cannabis use. He is currently prescribed Celexa 20 mg daily, hydroxyzine 10 mg three times daily as needed, and Trazodone 50 mg nightly as needed. He notes that he received Celexa and [facility] however notes that he has been unable to afford picking them up.  Today patient was unable to login virtually so assessment was done over the phone.  During exam he was pleasant, cooperative, and engaged in conversation.  He informed Clinical research associate that mentally everything has been fine. He does note that he has situational anxiety as he maybe on the verge of being homeless. Patient notes that he is behind on his rent and waiting for assistance from DSS. He also notes that he continues to worry about his children who he has not seen since May 2022 and finances. Today provider conducted a GAD 7 and patient scored a 15, at his last visit he scored an 11. Provider also conducted PHQ-9 he scored a 15, at his last visit he scored a 6.  He endorses adequate  sleep and appetite.  Today he denies SI/HI/VAH, mania, or paranoia.   Patient notes he continues to be sober from alcohol and marijuana.  No medication changes made today. Patient agreeable to continue medications as prescribed.   Visit Diagnosis:    ICD-10-CM   1. Mild episode of recurrent major depressive disorder (HCC)  F33.0 traZODone (DESYREL) 50 MG tablet    citalopram (CELEXA) 20 MG tablet    2. Generalized anxiety disorder  F41.1 traZODone (DESYREL) 50 MG tablet    hydrOXYzine (ATARAX) 10 MG tablet    citalopram (CELEXA) 20 MG tablet      Past Psychiatric History: Substance induced mood disorder, anxiety, panic attacks, alcohol use  and cannabis use  Past Medical History:  Past Medical History:  Diagnosis Date   Anxiety    Depression    Marijuana use 05/06/2021   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HCC) 05/06/2021   Panic disorder 05/06/2021   Unspecified mood (affective) disorder (HCC) 05/06/2021   History reviewed. No pertinent surgical history.  Family Psychiatric History:  Notes that family members that has anxiety, depression, substance   Family History: History reviewed. No pertinent family history.  Social History:  Social History   Socioeconomic History   Marital status: Significant Other    Spouse name: Not on file   Number of children: Not on file   Years of education: Not on file   Highest education level: Not on file  Occupational History   Not on file  Tobacco Use   Smoking status: Every Day  Packs/day: 1.00    Years: 15.00    Pack years: 15.00    Types: Cigarettes   Smokeless tobacco: Never  Substance and Sexual Activity   Alcohol use: Yes    Comment: 3-4 glasses wine/beer every other day   Drug use: No   Sexual activity: Not on file  Other Topics Concern   Not on file  Social History Narrative   Not on file   Social Determinants of Health   Financial Resource Strain: Not on file  Food Insecurity: Not on file  Transportation Needs: Not  on file  Physical Activity: Not on file  Stress: Not on file  Social Connections: Not on file    Allergies: No Known Allergies  Metabolic Disorder Labs: Lab Results  Component Value Date   HGBA1C 5.4 05/05/2021   MPG 108.28 05/05/2021   No results found for: PROLACTIN Lab Results  Component Value Date   CHOL 176 05/05/2021   TRIG 582 (H) 05/05/2021   HDL 62 05/05/2021   CHOLHDL 2.8 05/05/2021   VLDL UNABLE TO CALCULATE IF TRIGLYCERIDE OVER 400 mg/dL 16/10/960405/22/2022   LDLCALC UNABLE TO CALCULATE IF TRIGLYCERIDE OVER 400 mg/dL 54/09/811905/22/2022   Lab Results  Component Value Date   TSH 2.472 05/06/2021    Therapeutic Level Labs: No results found for: LITHIUM No results found for: VALPROATE No components found for:  CBMZ  Current Medications: Current Outpatient Medications  Medication Sig Dispense Refill   ascorbic acid (VITAMIN C) 100 MG tablet Take 100 mg by mouth daily.     citalopram (CELEXA) 20 MG tablet Take 1 tablet (20 mg total) by mouth daily. 30 tablet 3   Ergocalciferol (VITAMIN D2 PO) Take 1 tablet by mouth daily.     hydrOXYzine (ATARAX) 10 MG tablet Take 1 tablet (10 mg total) by mouth 3 (three) times daily as needed. 90 tablet 3   Multiple Vitamin (MULTIVITAMIN WITH MINERALS) TABS tablet Take 1 tablet by mouth daily.     Omega-3 1000 MG CAPS Take 1,000 mg by mouth daily.     traZODone (DESYREL) 50 MG tablet Take 1 tablet (50 mg total) by mouth at bedtime as needed for sleep. 30 tablet 3   No current facility-administered medications for this visit.     Musculoskeletal: Strength & Muscle Tone:  Unable to assess due to telephone visit Gait & Station:  Unable to assess due to telephone visit Patient leans: N/A  Psychiatric Specialty Exam: Review of Systems  There were no vitals taken for this visit.There is no height or weight on file to calculate BMI.  General Appearance:  Unable to assess due to telephone visit  Eye Contact:   Unable to assess due to telephone  visit  Speech:  Clear and Coherent and Normal Rate  Volume:  Normal  Mood:  Euthymic, notes at times he is anxious and depressed however is able to cope with  Affect:  Appropriate and Congruent  Thought Process:  Coherent, Goal Directed, and Linear  Orientation:  Full (Time, Place, and Person)  Thought Content: WDL and Logical   Suicidal Thoughts:  No  Homicidal Thoughts:  No  Memory:  Immediate;   Good Recent;   Good Remote;   Good  Judgement:  Good  Insight:  Good  Psychomotor Activity:   Unable to assess due to telephone visit  Concentration:  Concentration: Good and Attention Span: Good  Recall:  Good  Fund of Knowledge: Good  Language: Good  Akathisia:   Unable to  assess due to telephone visit  Handed:  Right  AIMS (if indicated): not done  Assets:  Communication Skills Desire for Improvement Financial Resources/Insurance Housing Leisure Time Physical Health Social Support  ADL's:  Intact  Cognition: WNL  Sleep:  Good   Screenings: AIMS    Flowsheet Row [facility]  AIMS Total Score 0      AUDIT    Flowsheet Row [facility]  Alcohol Use Disorder Identification Test Final Score (AUDIT) 9      GAD-7    Flowsheet Row Video Visit from 12/31/2021 in [facility] Video Visit from 09/30/2021 in [facility] Video Visit from 07/01/2021 in [facility]  Total GAD-7 Score 15 11 12       PHQ2-9    Flowsheet Row Video Visit from 12/31/2021 in [facility] Video Visit from 09/30/2021 in [facility] Video Visit from 07/01/2021 in [facility]  PHQ-2 Total Score 2 2 0  PHQ-9 Total Score 7 6 1       Flowsheet Row Video Visit from 07/01/2021 in [facility]Discharged[facility] ED from 05/05/2021 in [facility] EMERGENCY DEPARTMENT  C-SSRS RISK CATEGORY No Risk No Risk No Risk        Assessment and Plan: Patient notes that he is doing well on his current medication regimen. No medication changes made today. Patient agreeable to continue medications as prescribed.  1. Mild episode of recurrent major depressive disorder (HCC)  Continue- citalopram (CELEXA) 20 MG tablet; Take 1 tablet (20 mg total) by mouth daily.  Dispense: 30 tablet; Refill: 3 Continue- traZODone (DESYREL) 50 MG tablet; Take 1 tablet (50 mg total) by mouth at bedtime as needed for sleep.   2. Generalized anxiety disorder  Continue- hydrOXYzine (ATARAX/VISTARIL) 10 MG tablet; Take 1 tablet (10 mg total) by mouth 3 (three) times daily as needed.  Dispense: 90 tablet; Refill: 3 Continue- citalopram (CELEXA) 20 MG tablet; Take 1 tablet (20 mg total) by mouth daily.  Dispense: 30 tablet; Refill: 3 Continue- traZODone (DESYREL) 50 MG tablet; Take 1 tablet (50 mg total) by mouth at bedtime as needed for sleep.  Dispense: 30 tablet; Refill: 3   Follow-up in 3 Follow-up with therapy   05/07/2021, NP 12/31/2021, 12:24 PM

## 2022-01-07 ENCOUNTER — Other Ambulatory Visit: Payer: Self-pay

## 2022-03-13 ENCOUNTER — Encounter (HOSPITAL_COMMUNITY): Payer: Self-pay

## 2022-03-13 ENCOUNTER — Telehealth (HOSPITAL_COMMUNITY): Payer: No Payment, Other | Admitting: Psychiatry

## 2022-04-29 ENCOUNTER — Telehealth (INDEPENDENT_AMBULATORY_CARE_PROVIDER_SITE_OTHER): Payer: No Payment, Other | Admitting: Psychiatry

## 2022-04-29 DIAGNOSIS — F330 Major depressive disorder, recurrent, mild: Secondary | ICD-10-CM

## 2022-04-29 DIAGNOSIS — F411 Generalized anxiety disorder: Secondary | ICD-10-CM | POA: Diagnosis not present

## 2022-04-29 NOTE — Progress Notes (Signed)
BH MD/PA/NP OP Progress Note ? ?04/29/2022 12:05 PM ?Damon Olson  ?MRN:  [id number] ? ?Virtual Visit via Telephone Note ? ?I connected with Damon Olson on 04/29/22 at 10:00 AM EDT by telephone and verified that I am speaking with the correct person using two identifiers. ? ?Location: ?Patient: home ?Provider: offsite ?  ?I discussed the limitations, risks, security and privacy concerns of performing an evaluation and management service by telephone and the availability of in person appointments. I also discussed with the patient that there may be a patient responsible charge related to this service. The patient expressed understanding and agreed to proceed. ? ? ?  ?I discussed the assessment and treatment plan with the patient. The patient was provided an opportunity to ask questions and all were answered. The patient agreed with the plan and demonstrated an understanding of the instructions. ?  ?The patient was advised to call back or seek an in-person evaluation if the symptoms worsen or if the condition fails to improve as anticipated. ? ?I provided 10 minutes of non-face-to-face time during this encounter. ? ? ?Franne Grip, NP  ? ?Chief Complaint: Medication management ? ?HPI: Damon Olson is a 46 year old male presenting to [facility] behavioral health outpatient for follow-up psychiatric evaluation.  Patient is being seen by this provider as his attending psychiatric provider is unavailable to complete today's appointment.  Patient has a psychiatric history of generalized anxiety disorder, panic disorder, major depressive disorder,  alcohol use disorder and marijuana use.  Patient symptoms are managed with trazodone 50 mg at bedtime as needed for sleep, hydroxyzine 10 mg 3 times daily as needed for anxiety and Celexa 20 mg daily.  Patient reports that medications are effective with managing his symptoms and denies adverse medication effects or need for dosage adjustment today.  No medication changes  today. ? ?Visit Diagnosis:  ?  ICD-10-CM   ?1. Mild episode of recurrent major depressive disorder ([place])  F33.0   ?  ?2. Generalized anxiety disorder  F41.1   ?  ? ? ?Past Psychiatric History: Major depressive disorder, generalized anxiety disorder ? ?Past Medical History:  ?Past Medical History:  ?Diagnosis Date  ? Anxiety   ? Depression   ? Marijuana use 05/06/2021  ? 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[place]) 05/06/2021  ? Panic disorder 05/06/2021  ? Unspecified mood (affective) disorder ([place]) 05/06/2021  ? No past surgical history on file. ? ?Family Psychiatric History: N/A ? ?Family History: No family history on file. ? ?Social History:  ?Social History  ? ?Socioeconomic History  ? Marital status: Significant Other  ?  Spouse name: Not on file  ? Number of children: Not on file  ? Years of education: Not on file  ? Highest education level: Not on file  ?Occupational History  ? Not on file  ?Tobacco Use  ? Smoking status: Every Day  ?  Packs/day: 1.00  ?  Years: 15.00  ?  Pack years: 15.00  ?  Types: Cigarettes  ? Smokeless tobacco: Never  ?Substance and Sexual Activity  ? Alcohol use: Yes  ?  Comment: 3-4 glasses wine/beer every other day  ? Drug use: No  ? Sexual activity: Not on file  ?Other Topics Concern  ? Not on file  ?Social History Narrative  ? Not on file  ? ?Social Determinants of Health  ? ?Financial Resource Strain: Not on file  ?Food Insecurity: Not on file  ?Transportation Needs: Not on file  ?Physical Activity: Not on file  ?  Stress: Not on file  ?Social Connections: Not on file  ? ? ?Allergies: No Known Allergies ? ?Metabolic Disorder Labs: ?Lab Results  ?Component Value Date  ? HGBA1C 5.4 05/05/2021  ? MPG 108.28 05/05/2021  ? ?No results found for: PROLACTIN ?Lab Results  ?Component Value Date  ? CHOL 176 05/05/2021  ? TRIG 582 (H) 05/05/2021  ? HDL 62 05/05/2021  ? CHOLHDL 2.8 05/05/2021  ? VLDL UNABLE TO CALCULATE IF TRIGLYCERIDE OVER 400 mg/dL 05/05/2021  ? LDLCALC UNABLE TO CALCULATE IF TRIGLYCERIDE  OVER 400 mg/dL 05/05/2021  ? ?Lab Results  ?Component Value Date  ? TSH 2.472 05/06/2021  ? ? ?Therapeutic Level Labs: ?No results found for: LITHIUM ?No results found for: VALPROATE ?No components found for:  CBMZ ? ?Current Medications: ?Current Outpatient Medications  ?Medication Sig Dispense Refill  ? ascorbic acid (VITAMIN C) 100 MG tablet Take 100 mg by mouth daily.    ? citalopram (CELEXA) 20 MG tablet Take 1 tablet (20 mg total) by mouth daily. 30 tablet 3  ? Ergocalciferol (VITAMIN D2 PO) Take 1 tablet by mouth daily.    ? hydrOXYzine (ATARAX) 10 MG tablet Take 1 tablet (10 mg total) by mouth 3 (three) times daily as needed. 90 tablet 3  ? Multiple Vitamin (MULTIVITAMIN WITH MINERALS) TABS tablet Take 1 tablet by mouth daily.    ? Omega-3 1000 MG CAPS Take 1,000 mg by mouth daily.    ? traZODone (DESYREL) 50 MG tablet Take 1 tablet (50 mg total) by mouth at bedtime as needed for sleep. 30 tablet 3  ? ?No current facility-administered medications for this visit.  ? ? ? ?Musculoskeletal: ?Strength & Muscle Tone: n/a ?Gait & Station: n/a ?Patient leans: n/a ? ?Psychiatric Specialty Exam: ?Review of Systems  ?Psychiatric/Behavioral:  Negative for hallucinations, self-injury and suicidal ideas.   ?All other systems reviewed and are negative.  ?There were no vitals taken for this visit.There is no height or weight on file to calculate BMI.  ?General Appearance: n/a  ?Eye Contact:  n/a  ?Speech:  Clear and Coherent  ?Volume:  Normal  ?Mood:  Euthymic  ?Affect:  NA  ?Thought Process:  Coherent  ?Orientation:  Full (Time, Place, and Person)  ?Thought Content: Logical   ?Suicidal Thoughts:  No  ?Homicidal Thoughts:  No  ?Memory:  good  ?Judgement:  Good  ?Insight:  Good  ?Psychomotor Activity:  NA  ?Concentration:  good  ?Recall:  Good  ?Fund of Knowledge: Good  ?Language: Good  ?Akathisia:  NA  ?Handed:  Right  ?AIMS (if indicated): not done  ?Assets:  Communication Skills  ?ADL's:  Intact  ?Cognition: WNL  ?Sleep:   Good  ? ?Screenings: ?AIMS   ? ?Flowsheet Row Admission (Discharged) from 05/06/2021 in [place] [facility]  ?AIMS Total Score 0  ? ?  ? ?AUDIT   ? ?Flowsheet Row Admission (Discharged) from 05/06/2021 in [place] [facility]  ?Alcohol Use Disorder Identification Test Final Score (AUDIT) 9  ? ?  ? ?GAD-7   ? ?Flowsheet Row Video Visit from 12/31/2021 in [facility] Video Visit from 09/30/2021 in [facility] Video Visit from 07/01/2021 in [facility]  ?Total GAD-7 Score 15 11 12   ? ?  ? ?PHQ2-9   ? ?Flowsheet Row Video Visit from 12/31/2021 in [facility] Video Visit from 09/30/2021 in [facility] Video Visit from  07/01/2021 in [facility]  ?PHQ-2 Total Score 2 2 0  ?PHQ-9 Total Score 7 6 1   ? ?  ? ?Flowsheet Row Video Visit from 07/01/2021 in [facility] Admission (Discharged) from 05/06/2021 in [place] [facility] ED from 05/05/2021 in [place]  ?C-SSRS RISK CATEGORY No Risk No Risk No Risk  ? ?  ? ? ? ?Assessment and Plan:  Lyan Baumgardner is a 46 year old male presenting to [facility] behavioral health outpatient for follow-up psychiatric evaluation.  Patient is being seen by this provider as his attending psychiatric provider is unavailable to complete today's appointment.  Patient has a psychiatric history of generalized anxiety disorder, panic disorder, major depressive disorder,  alcohol use disorder and marijuana use.  Patient symptoms are managed with trazodone 50 mg at bedtime as needed for sleep, hydroxyzine 10 mg 3 times daily as needed for anxiety and Celexa 20 mg daily.  Patient reports that medications are effective with managing his symptoms and denies adverse medication effects or need for  dosage adjustment today.  No medication changes today.  Medications not refilled today as patient has available refills ready for pickup at his preferred pharmacy. ? ? ? ?Collaboration of Care: [employer]

## 2022-07-30 ENCOUNTER — Encounter (HOSPITAL_COMMUNITY): Payer: Self-pay

## 2022-07-30 ENCOUNTER — Telehealth (HOSPITAL_COMMUNITY): Payer: No Payment, Other | Admitting: Psychiatry

## 2025-01-04 ENCOUNTER — Emergency Department (HOSPITAL_COMMUNITY): Payer: Self-pay

## 2025-01-04 ENCOUNTER — Inpatient Hospital Stay (HOSPITAL_COMMUNITY)
Admission: EM | Admit: 2025-01-04 | Discharge: 2025-01-12 | DRG: 640 | Disposition: A | Discharge status: Home / Self Care | Payer: Self-pay | Attending: Internal Medicine | Admitting: Internal Medicine

## 2025-01-04 ENCOUNTER — Other Ambulatory Visit: Payer: Self-pay

## 2025-01-04 ENCOUNTER — Encounter (HOSPITAL_COMMUNITY): Payer: Self-pay

## 2025-01-04 DIAGNOSIS — R9431 Abnormal electrocardiogram [ECG] [EKG]: Secondary | ICD-10-CM | POA: Diagnosis present

## 2025-01-04 DIAGNOSIS — R030 Elevated blood-pressure reading, without diagnosis of hypertension: Secondary | ICD-10-CM | POA: Diagnosis present

## 2025-01-04 DIAGNOSIS — Z7141 Alcohol abuse counseling and surveillance of alcoholic: Secondary | ICD-10-CM

## 2025-01-04 DIAGNOSIS — J45909 Unspecified asthma, uncomplicated: Secondary | ICD-10-CM | POA: Diagnosis present

## 2025-01-04 DIAGNOSIS — N179 Acute kidney failure, unspecified: Secondary | ICD-10-CM

## 2025-01-04 DIAGNOSIS — R931 Abnormal findings on diagnostic imaging of heart and coronary circulation: Secondary | ICD-10-CM | POA: Diagnosis present

## 2025-01-04 DIAGNOSIS — K2970 Gastritis, unspecified, without bleeding: Secondary | ICD-10-CM | POA: Diagnosis present

## 2025-01-04 DIAGNOSIS — R32 Unspecified urinary incontinence: Secondary | ICD-10-CM | POA: Diagnosis present

## 2025-01-04 DIAGNOSIS — Z79899 Other long term (current) drug therapy: Secondary | ICD-10-CM

## 2025-01-04 DIAGNOSIS — E8721 Acute metabolic acidosis: Secondary | ICD-10-CM | POA: Diagnosis present

## 2025-01-04 DIAGNOSIS — K30 Functional dyspepsia: Secondary | ICD-10-CM | POA: Diagnosis present

## 2025-01-04 DIAGNOSIS — R197 Diarrhea, unspecified: Secondary | ICD-10-CM | POA: Diagnosis present

## 2025-01-04 DIAGNOSIS — E876 Hypokalemia: Principal | ICD-10-CM | POA: Diagnosis present

## 2025-01-04 DIAGNOSIS — I426 Alcoholic cardiomyopathy: Secondary | ICD-10-CM | POA: Diagnosis present

## 2025-01-04 DIAGNOSIS — J189 Pneumonia, unspecified organism: Secondary | ICD-10-CM | POA: Diagnosis present

## 2025-01-04 DIAGNOSIS — M25552 Pain in left hip: Secondary | ICD-10-CM | POA: Diagnosis present

## 2025-01-04 DIAGNOSIS — K5900 Constipation, unspecified: Secondary | ICD-10-CM | POA: Diagnosis present

## 2025-01-04 DIAGNOSIS — R0789 Other chest pain: Secondary | ICD-10-CM | POA: Diagnosis present

## 2025-01-04 DIAGNOSIS — W1830XA Fall on same level, unspecified, initial encounter: Secondary | ICD-10-CM | POA: Diagnosis present

## 2025-01-04 DIAGNOSIS — Z823 Family history of stroke: Secondary | ICD-10-CM

## 2025-01-04 DIAGNOSIS — I959 Hypotension, unspecified: Secondary | ICD-10-CM | POA: Diagnosis present

## 2025-01-04 DIAGNOSIS — F1210 Cannabis abuse, uncomplicated: Secondary | ICD-10-CM | POA: Diagnosis present

## 2025-01-04 DIAGNOSIS — F419 Anxiety disorder, unspecified: Secondary | ICD-10-CM | POA: Diagnosis present

## 2025-01-04 DIAGNOSIS — R636 Underweight: Secondary | ICD-10-CM | POA: Diagnosis present

## 2025-01-04 DIAGNOSIS — Z820 Family history of epilepsy and other diseases of the nervous system: Secondary | ICD-10-CM

## 2025-01-04 DIAGNOSIS — K209 Esophagitis, unspecified without bleeding: Secondary | ICD-10-CM | POA: Diagnosis present

## 2025-01-04 DIAGNOSIS — D649 Anemia, unspecified: Secondary | ICD-10-CM | POA: Diagnosis present

## 2025-01-04 DIAGNOSIS — F10288 Alcohol dependence with other alcohol-induced disorder: Secondary | ICD-10-CM | POA: Diagnosis present

## 2025-01-04 DIAGNOSIS — Z681 Body mass index (BMI) 19 or less, adult: Secondary | ICD-10-CM

## 2025-01-04 DIAGNOSIS — F17210 Nicotine dependence, cigarettes, uncomplicated: Secondary | ICD-10-CM | POA: Diagnosis present

## 2025-01-04 DIAGNOSIS — Y92524 Gas station as the place of occurrence of the external cause: Secondary | ICD-10-CM

## 2025-01-04 DIAGNOSIS — Y9301 Activity, walking, marching and hiking: Secondary | ICD-10-CM | POA: Diagnosis present

## 2025-01-04 DIAGNOSIS — R627 Adult failure to thrive: Secondary | ICD-10-CM | POA: Diagnosis present

## 2025-01-04 DIAGNOSIS — R55 Syncope and collapse: Principal | ICD-10-CM | POA: Diagnosis present

## 2025-01-04 DIAGNOSIS — Z1152 Encounter for screening for COVID-19: Secondary | ICD-10-CM

## 2025-01-04 DIAGNOSIS — F17290 Nicotine dependence, other tobacco product, uncomplicated: Secondary | ICD-10-CM | POA: Diagnosis present

## 2025-01-04 DIAGNOSIS — F32A Depression, unspecified: Secondary | ICD-10-CM | POA: Diagnosis present

## 2025-01-04 DIAGNOSIS — E8729 Other acidosis: Secondary | ICD-10-CM | POA: Diagnosis present

## 2025-01-04 DIAGNOSIS — Z8249 Family history of ischemic heart disease and other diseases of the circulatory system: Secondary | ICD-10-CM

## 2025-01-04 DIAGNOSIS — M25551 Pain in right hip: Secondary | ICD-10-CM | POA: Diagnosis present

## 2025-01-04 DIAGNOSIS — F39 Unspecified mood [affective] disorder: Secondary | ICD-10-CM | POA: Diagnosis present

## 2025-01-04 DIAGNOSIS — K852 Alcohol induced acute pancreatitis without necrosis or infection: Secondary | ICD-10-CM | POA: Diagnosis present

## 2025-01-04 LAB — CBC WITH DIFFERENTIAL/PLATELET
Abs Immature Granulocytes: 0.1 K/uL — ABNORMAL HIGH (ref 0.00–0.07)
Basophils Absolute: 0 K/uL (ref 0.0–0.1)
Basophils Relative: 0 %
Eosinophils Absolute: 0 K/uL (ref 0.0–0.5)
Eosinophils Relative: 0 %
HCT: 30.3 % — ABNORMAL LOW (ref 39.0–52.0)
Hemoglobin: 11.3 g/dL — ABNORMAL LOW (ref 13.0–17.0)
Immature Granulocytes: 1 %
Lymphocytes Relative: 6 %
Lymphs Abs: 0.8 K/uL (ref 0.7–4.0)
MCH: 35.6 pg — ABNORMAL HIGH (ref 26.0–34.0)
MCHC: 37.3 g/dL — ABNORMAL HIGH (ref 30.0–36.0)
MCV: 95.6 fL (ref 80.0–100.0)
Monocytes Absolute: 1.1 K/uL — ABNORMAL HIGH (ref 0.1–1.0)
Monocytes Relative: 9 %
Neutro Abs: 10.4 K/uL — ABNORMAL HIGH (ref 1.7–7.7)
Neutrophils Relative %: 84 %
Platelets: 389 K/uL (ref 150–400)
RBC: 3.17 MIL/uL — ABNORMAL LOW (ref 4.22–5.81)
RDW: 16.8 % — ABNORMAL HIGH (ref 11.5–15.5)
WBC: 12.4 K/uL — ABNORMAL HIGH (ref 4.0–10.5)
nRBC: 0.2 % (ref 0.0–0.2)

## 2025-01-04 LAB — URINALYSIS, ROUTINE W REFLEX MICROSCOPIC
Bilirubin Urine: NEGATIVE
Glucose, UA: NEGATIVE mg/dL
Hgb urine dipstick: NEGATIVE
Ketones, ur: 5 mg/dL — AB
Leukocytes,Ua: NEGATIVE
Nitrite: NEGATIVE
Protein, ur: 100 mg/dL — AB
Specific Gravity, Urine: 1.018 (ref 1.005–1.030)
pH: 5 (ref 5.0–8.0)

## 2025-01-04 LAB — COMPREHENSIVE METABOLIC PANEL WITH GFR
ALT: 36 U/L (ref 0–44)
AST: 74 U/L — ABNORMAL HIGH (ref 15–41)
Albumin: 3.9 g/dL (ref 3.5–5.0)
Alkaline Phosphatase: 130 U/L — ABNORMAL HIGH (ref 38–126)
Anion gap: 19 — ABNORMAL HIGH (ref 5–15)
BUN: 45 mg/dL — ABNORMAL HIGH (ref 6–20)
CO2: 23 mmol/L (ref 22–32)
Calcium: 9.7 mg/dL (ref 8.9–10.3)
Chloride: 91 mmol/L — ABNORMAL LOW (ref 98–111)
Creatinine, Ser: 1.45 mg/dL — ABNORMAL HIGH (ref 0.61–1.24)
GFR, Estimated: 60 mL/min — ABNORMAL LOW
Glucose, Bld: 181 mg/dL — ABNORMAL HIGH (ref 70–99)
Potassium: 2.6 mmol/L — CL (ref 3.5–5.1)
Sodium: 132 mmol/L — ABNORMAL LOW (ref 135–145)
Total Bilirubin: 0.7 mg/dL (ref 0.0–1.2)
Total Protein: 7.3 g/dL (ref 6.5–8.1)

## 2025-01-04 LAB — PROTIME-INR
INR: 1 (ref 0.8–1.2)
Prothrombin Time: 14.2 s (ref 11.4–15.2)

## 2025-01-04 LAB — I-STAT CHEM 8, ED
BUN: 50 mg/dL — ABNORMAL HIGH (ref 6–20)
Calcium, Ion: 1.09 mmol/L — ABNORMAL LOW (ref 1.15–1.40)
Chloride: 95 mmol/L — ABNORMAL LOW (ref 98–111)
Creatinine, Ser: 1.4 mg/dL — ABNORMAL HIGH (ref 0.61–1.24)
Glucose, Bld: 188 mg/dL — ABNORMAL HIGH (ref 70–99)
HCT: 35 % — ABNORMAL LOW (ref 39.0–52.0)
Hemoglobin: 11.9 g/dL — ABNORMAL LOW (ref 13.0–17.0)
Potassium: 2.5 mmol/L — CL (ref 3.5–5.1)
Sodium: 132 mmol/L — ABNORMAL LOW (ref 135–145)
TCO2: 24 mmol/L (ref 22–32)

## 2025-01-04 LAB — RESP PANEL BY RT-PCR (RSV, FLU A&B, COVID)  RVPGX2
Influenza A by PCR: NEGATIVE
Influenza B by PCR: NEGATIVE
Resp Syncytial Virus by PCR: NEGATIVE
SARS Coronavirus 2 by RT PCR: NEGATIVE

## 2025-01-04 LAB — ETHANOL: Alcohol, Ethyl (B): <15 mg/dL

## 2025-01-04 LAB — CBG MONITORING, ED: Glucose-Capillary: 179 mg/dL — ABNORMAL HIGH (ref 70–99)

## 2025-01-04 LAB — I-STAT CG4 LACTIC ACID, ED: Lactic Acid, Venous: 2.5 mmol/L (ref 0.5–1.9)

## 2025-01-04 LAB — MAGNESIUM: Magnesium: 1.6 mg/dL — ABNORMAL LOW (ref 1.7–2.4)

## 2025-01-04 MED ORDER — MAGNESIUM SULFATE 2 GM/50ML IV SOLN
2.0000 g | Freq: Once | INTRAVENOUS | Status: AC
  Administered 2025-01-04: 2 g via INTRAVENOUS
  Filled 2025-01-04: qty 50

## 2025-01-04 MED ORDER — POTASSIUM CHLORIDE 10 MEQ/100ML IV SOLN
10.0000 meq | INTRAVENOUS | Status: AC
  Administered 2025-01-04 – 2025-01-05 (×3): 10 meq via INTRAVENOUS
  Filled 2025-01-04 (×3): qty 100

## 2025-01-04 MED ORDER — SODIUM CHLORIDE 0.9 % IV BOLUS
1000.0000 mL | Freq: Once | INTRAVENOUS | Status: AC
  Administered 2025-01-04: 1000 mL via INTRAVENOUS

## 2025-01-04 MED ORDER — DOXYCYCLINE HYCLATE 100 MG PO TABS
100.0000 mg | ORAL_TABLET | Freq: Once | ORAL | Status: AC
  Administered 2025-01-04: 100 mg via ORAL
  Filled 2025-01-04: qty 1

## 2025-01-04 MED ORDER — SODIUM CHLORIDE 0.9 % IV SOLN
1.0000 g | Freq: Once | INTRAVENOUS | Status: AC
  Administered 2025-01-04: 1 g via INTRAVENOUS
  Filled 2025-01-04: qty 10

## 2025-01-04 NOTE — ED Triage Notes (Addendum)
 Pt BIB [facility]. Pt had two witnessed syncopal episodes. Positive LOC on second fall.  EMS reports eyes were tracking but not responsive. Endorses n/v/d. Having chills/tremors. Pt c/o head and neck pain. C-collar in place. Having new incontinence GCS 15  EMS 100/60BP 227cbg 110P 500NS 500LR

## 2025-01-04 NOTE — ED Provider Notes (Signed)
 " [place] EMERGENCY DEPARTMENT AT [facility] Provider Note   CSN: [id number] Arrival date & time: 01/04/25  2156     Patient presents with: Loss of Consciousness   Damon Olson is a 49 y.o. male.   49 yo male brought in by EMS for possible syncopal episode.  Patient states he drove to a gas station and was walking around the convenience store and the next thing he remembers is being in the ER. Reports regular alcohol use with last intake this past weekend (3 days ago), no history of alcohol related seizures. Reports pain in the back of his head, intermittent jerking of arms and legs, patient denies recent illness including changes in bowel habits, nausea/vomiting. EMS reports vomiting and diarrhea.  EMS reports +urinary incontinence Patient admits to [facility] use Visiting from [facility].       Prior to Admission medications  Medication Sig Start Date End Date Taking? Authorizing Provider  citalopram  (CELEXA ) 20 MG tablet Take 1 tablet (20 mg total) by mouth daily. 12/31/21  Yes Harl Regan E, NP  ibuprofen  (ADVIL ) 200 MG tablet Take 200 mg by mouth. 12/24/17  Yes [provider]  albuterol (VENTOLIN HFA) 108 (90 Base) MCG/ACT inhaler Inhale 2 puffs into the lungs every 6 (six) hours as needed for wheezing or shortness of breath.    [provider]  ascorbic acid (VITAMIN C) 100 MG tablet Take 100 mg by mouth daily.    [provider]  Ergocalciferol (VITAMIN D2 PO) Take 1 tablet by mouth daily.    [provider]  hydrOXYzine  (ATARAX ) 10 MG tablet Take 1 tablet (10 mg total) by mouth 3 (three) times daily as needed. 12/31/21   Parsons, Brittney E, NP  Multiple Vitamin (MULTIVITAMIN WITH MINERALS) TABS tablet Take 1 tablet by mouth daily.    [provider]  Omega-3 1000 MG CAPS Take 1,000 mg by mouth daily.    [provider]  traZODone  (DESYREL ) 50 MG tablet Take 1 tablet (50 mg total) by mouth at bedtime as needed for sleep.  12/31/21   Harl Regan BRAVO, NP    Allergies: Patient has no known allergies.    Review of Systems Negative except as per HPI Updated Vital Signs BP (!) 121/91   Pulse 96   Temp 98 F (36.7 C) (Temporal)   Resp 20   Ht 6' 1 (1.854 m)   Wt 65.8 kg   SpO2 100%   BMI 19.13 kg/m   Physical Exam Vitals and nursing note reviewed.  Constitutional:      General: He is not in acute distress.    Appearance: He is well-developed and underweight. He is not diaphoretic.  HENT:     Head: Normocephalic and atraumatic.     Nose: Nose normal.     Mouth/Throat:     Mouth: Mucous membranes are moist.  Eyes:     Conjunctiva/sclera: Conjunctivae normal.     Pupils: Pupils are equal, round, and reactive to light.  Cardiovascular:     Rate and Rhythm: Regular rhythm. Tachycardia present.     Pulses: Normal pulses.     Heart sounds: Normal heart sounds.  Pulmonary:     Effort: Pulmonary effort is normal.     Breath sounds: Normal breath sounds.  Abdominal:     Palpations: Abdomen is soft.     Tenderness: There is no abdominal tenderness.  Musculoskeletal:     Cervical back: Tenderness present.     Right  lower leg: No edema.     Left lower leg: No edema.  Skin:    General: Skin is warm and dry.  Neurological:     Mental Status: He is alert and oriented to person, place, and time.     Comments: Intermittent twitching/spasms of the upper and lower extremities   Psychiatric:        Behavior: Behavior normal.     (all labs ordered are listed, but only abnormal results are displayed) Labs Reviewed  CBC WITH DIFFERENTIAL/PLATELET - Abnormal; Notable for the following components:      Result Value   WBC 12.4 (*)    RBC 3.17 (*)    Hemoglobin 11.3 (*)    HCT 30.3 (*)    MCH 35.6 (*)    MCHC 37.3 (*)    RDW 16.8 (*)    Neutro Abs 10.4 (*)    Monocytes Absolute 1.1 (*)    Abs Immature Granulocytes 0.10 (*)    All other components within normal limits  COMPREHENSIVE METABOLIC  PANEL WITH GFR - Abnormal; Notable for the following components:   Sodium 132 (*)    Potassium 2.6 (*)    Chloride 91 (*)    Glucose, Bld 181 (*)    BUN 45 (*)    Creatinine, Ser 1.45 (*)    AST 74 (*)    Alkaline Phosphatase 130 (*)    GFR, Estimated 60 (*)    Anion gap 19 (*)    All other components within normal limits  URINALYSIS, ROUTINE W REFLEX MICROSCOPIC - Abnormal; Notable for the following components:   APPearance CLOUDY (*)    Ketones, ur 5 (*)    Protein, ur 100 (*)    Bacteria, UA RARE (*)    All other components within normal limits  URINE DRUG SCREEN - Abnormal; Notable for the following components:   Tetrahydrocannabinol POSITIVE (*)    All other components within normal limits  MAGNESIUM  - Abnormal; Notable for the following components:   Magnesium  1.6 (*)    All other components within normal limits  I-STAT CHEM 8, ED - Abnormal; Notable for the following components:   Sodium 132 (*)    Potassium 2.5 (*)    Chloride 95 (*)    BUN 50 (*)    Creatinine, Ser 1.40 (*)    Glucose, Bld 188 (*)    Calcium, Ion 1.09 (*)    Hemoglobin 11.9 (*)    HCT 35.0 (*)    All other components within normal limits  I-STAT CG4 LACTIC ACID, ED - Abnormal; Notable for the following components:   Lactic Acid, Venous 2.5 (*)    All other components within normal limits  CBG MONITORING, ED - Abnormal; Notable for the following components:   Glucose-Capillary 179 (*)    All other components within normal limits  RESP PANEL BY RT-PCR (RSV, FLU A&B, COVID)  RVPGX2  CULTURE, BLOOD (ROUTINE X 2)  CULTURE, BLOOD (ROUTINE X 2)  EXPECTORATED SPUTUM ASSESSMENT W GRAM STAIN, RFLX TO RESP C  ETHANOL  PROTIME-INR  HIV ANTIBODY (ROUTINE TESTING W REFLEX)  PHOSPHORUS  MAGNESIUM   COMPREHENSIVE METABOLIC PANEL WITH GFR  CBC  I-STAT CG4 LACTIC ACID, ED    EKG: EKG Interpretation Date/Time:  Wednesday January 04 2025 22:04:59 EST Ventricular Rate:  116 PR Interval:  143 QRS  Duration:  81 QT Interval:  387 QTC Calculation: 538 R Axis:   83  Text Interpretation: Sinus tachycardia LVH with secondary repolarization  abnormality Anterior infarct, old Prolonged QT interval Confirmed by Freddi Hamilton [phone]) on 01/04/2025 10:25:22 PM  Radiology: [facility] Chest Port 1 View Result Date: 01/04/2025 EXAM: 1 VIEW(S) XRAY OF THE CHEST 01/04/2025 10:58:27 PM COMPARISON: 12/28/2017 CLINICAL HISTORY: Questionable sepsis - evaluate for abnormality. FINDINGS: LUNGS AND PLEURA: Bibasilar airspace opacities, left greater than right, concerning for pneumonia. No pleural effusion. No pneumothorax. HEART AND MEDIASTINUM: No acute abnormality of the cardiac and mediastinal silhouettes. BONES AND SOFT TISSUES: No acute osseous abnormality. IMPRESSION: 1. Bibasilar airspace opacities, left greater than right, suspicious for pneumonia. Electronically signed by: Franky Crease MD 01/04/2025 11:01 PM EST RP Workstation: [id number]   CT Cervical Spine Wo Contrast Result Date: 01/04/2025 EXAM: CT CERVICAL SPINE WITHOUT CONTRAST 01/04/2025 10:35:03 PM TECHNIQUE: CT of the cervical spine was performed without the administration of intravenous contrast. Multiplanar reformatted images are provided for review. Automated exposure control, iterative reconstruction, and/or weight based adjustment of the mA/kV was utilized to reduce the radiation dose to as low as reasonably achievable. COMPARISON: None available. CLINICAL HISTORY: Neck trauma, midline tenderness (Age 56-64y). FINDINGS: BONES AND ALIGNMENT: No acute fracture or traumatic malalignment. DEGENERATIVE CHANGES: No significant degenerative changes. SOFT TISSUES: No prevertebral soft tissue swelling. LUNGS: Mild paraseptal emphysematous changes at the lung apices. IMPRESSION: 1. No acute findings. 2. Emphysema (ICD10-J43.9). Electronically signed by: Pinkie Pebbles MD 01/04/2025 10:39 PM EST RP Workstation: [id number]   CT Head Wo Contrast Result Date:  01/04/2025 EXAM: CT HEAD WITHOUT CONTRAST 01/04/2025 10:35:03 PM TECHNIQUE: CT of the head was performed without the administration of intravenous contrast. Automated exposure control, iterative reconstruction, and/or weight based adjustment of the mA/kV was utilized to reduce the radiation dose to as low as reasonably achievable. COMPARISON: None available. CLINICAL HISTORY: Head trauma, repeat vomiting (Age 38-64y); seizure vs syncope, fall, head pain. FINDINGS: BRAIN AND VENTRICLES: No acute hemorrhage. No evidence of acute infarct. No hydrocephalus. No extra-axial collection. No mass effect or midline shift. ORBITS: No acute abnormality. SINUSES: No acute abnormality. SOFT TISSUES AND SKULL: No acute soft tissue abnormality. No skull fracture. IMPRESSION: 1. No acute intracranial abnormality. Electronically signed by: Pinkie Pebbles MD 01/04/2025 10:38 PM EST RP Workstation: [id number]     .Critical Care  Performed by: Beverley Leita LABOR, PA-C Authorized by: Beverley Leita LABOR, PA-C   Critical care provider statement:    Critical care time (minutes):  45   Critical care was time spent personally by me on the following activities:  Development of treatment plan with patient or surrogate, discussions with consultants, evaluation of patient's response to treatment, examination of patient, ordering and review of laboratory studies, ordering and review of radiographic studies, ordering and performing treatments and interventions, pulse oximetry, re-evaluation of patient's condition and review of old charts    Medications Ordered in the ED  LORazepam  (ATIVAN ) tablet 1-4 mg (has no administration in time range)    Or  LORazepam  (ATIVAN ) injection 1-4 mg (has no administration in time range)  thiamine  (VITAMIN B1) tablet 100 mg (has no administration in time range)    Or  thiamine  (VITAMIN B1) injection 100 mg (has no administration in time range)  folic acid  (FOLVITE ) tablet 1 mg (has no administration  in time range)  multivitamin with minerals tablet 1 tablet (has no administration in time range)  sodium chloride  flush (NS) 0.9 % injection 3 mL (3 mLs Intravenous Given 01/05/25 0253)  LORazepam  (ATIVAN ) injection 0-4 mg (1 mg Intravenous Given 01/05/25 0253)    Followed by  LORazepam  (  ATIVAN ) injection 0-4 mg (has no administration in time range)  lactated ringers  infusion ( Intravenous New Bag/Given 01/05/25 0259)  enoxaparin  (LOVENOX ) injection 40 mg (has no administration in time range)  acetaminophen  (TYLENOL ) tablet 650 mg (has no administration in time range)    Or  acetaminophen  (TYLENOL ) suppository 650 mg (has no administration in time range)  senna-docusate (Senokot-S) tablet 1 tablet (has no administration in time range)  prochlorperazine  (COMPAZINE ) injection 5 mg (has no administration in time range)  cefTRIAXone  (ROCEPHIN ) 2 g in sodium chloride  0.9 % 100 mL IVPB (has no administration in time range)  doxycycline  (VIBRAMYCIN ) 100 mg in sodium chloride  0.9 % 250 mL IVPB (has no administration in time range)  sodium chloride  0.9 % bolus 1,000 mL (0 mLs Intravenous Stopped 01/05/25 0008)  magnesium  sulfate IVPB 2 g 50 mL (0 g Intravenous Stopped 01/04/25 2358)  potassium chloride  10 mEq in 100 mL IVPB (0 mEq Intravenous Stopped 01/05/25 0144)  cefTRIAXone  (ROCEPHIN ) 1 g in sodium chloride  0.9 % 100 mL IVPB (0 g Intravenous Stopped 01/05/25 0017)  doxycycline  (VIBRA -TABS) tablet 100 mg (100 mg Oral Given 01/04/25 2344)  potassium chloride  SA (KLOR-CON  M) CR tablet 40 mEq (40 mEq Oral Given 01/05/25 0252)                                    Medical Decision Making Amount and/or Complexity of Data Reviewed Labs: ordered. Radiology: ordered.  Risk Prescription drug management. Decision regarding hospitalization.   This patient presents to the ED for concern of syncope vs seizure, this involves an extensive number of treatment options, and is a complaint that carries with it a high  risk of complications and morbidity.  The differential diagnosis includes alcohol withdrawal, metabolic/electrolyte derangement, intracranial injury, sepsis, pneumonia, covid/flu   Co morbidities / Chronic conditions that complicate the patient evaluation  Substance induced mood disorder, anxiety, marijuana use, alcohol use disorder, depression    Additional history obtained:  Additional history obtained from EMR External records from outside source obtained and reviewed including prior labs and imaging    Lab Tests:  I Ordered, and personally interpreted labs.  The pertinent results include: Lactic initially elevated at 2.5, has improved to 0.7 after fluid bolus.  Urinalysis with ketones, proteins and rare bacteria.  INR normal.  Magnesium  slightly low at 1.6.  Glucose normal at 179.  UDS positive for THC.  Negative for COVID, flu, RSV.  Alcohol negative.  CMP with significant hypokalemia with potassium of 2.6, AKI with creatinine of 1.45.   Imaging Studies ordered:  I ordered imaging studies including CT head, CT s-cpine, CXR  I independently visualized and interpreted imaging which showed concern for PNA I agree with the radiologist interpretation   Cardiac Monitoring: / EKG:  The patient was maintained on a cardiac monitor.  I personally viewed and interpreted the cardiac monitored which showed an underlying rhythm of: Sinus tachycardia, rate 116, prolonged QT   Problem List / ED Course / Critical interventions / Medication management  49 year old male brought in by EMS as above.  On arrival, patient is alert and oriented although unable to say what happened to him in the convenience story.  He smells of urine and has urine soaked clothing although denies incontinence.  He denies recent illness, vomiting, diarrhea although EMS reports he had reported vomiting and diarrhea.  He reports having chills and uncontrolled intermittent jerking of his  arms and legs.  He reports regular  alcohol use with last intake 3 days ago over the weekend.  His alcohol is negative.  His blood pressure has fluctuated, was initially hypotensive with EMS, trended up and blood pressure returned to near normal.  Provided with fluid bolus per sepsis protocol as well as antibiotics for possible pneumonia.  Given his QT prolongation, provided with Doxy in place of Zithromax  with Rocephin .  Potassium replaced IV with magnesium .  Admitted to hospital service for further management. I ordered medication including IVF per sepsis protocol (1L with EMS, 1 L in the department),    Reevaluation of the patient after these medicines showed that the patient stable I have reviewed the patients home medicines and have made adjustments as needed   Consultations Obtained:  I requested consultation with the hospitalist, Dr. Charlton,  and discussed lab and imaging findings as well as pertinent plan - they recommend: will consult for admission ER attending, Dr. Haze, agrees with plan of care    Social Determinants of Health:  Visiting from [place]    Test / Admission - Considered:  admit      Final diagnoses:  Syncope and collapse  Hypokalemia  QT prolongation  Pneumonia of both lower lobes due to infectious organism  AKI (acute kidney injury)  Hypomagnesemia    ED Discharge Orders     None          Beverley Leita LABOR, PA-C 01/05/25 0449    Haze Lonni PARAS, MD 01/05/25 [phone]  "

## 2025-01-04 NOTE — ED Provider Notes (Incomplete)
 " [place] EMERGENCY DEPARTMENT AT [facility] Provider Note   CSN: [id number] Arrival date & time: 01/04/25  2156     Patient presents with: Loss of Consciousness   Damon Olson is a 49 y.o. male.  {Add pertinent medical, surgical, social history, OB history to HPI:32947} 49 yo male brought in by EMS for possible syncopal episode.  Patient states he drove to a gas station and was walking around the convenience store and the next thing he remembers is being in the ER. Reports regular alcohol use with last intake this past weekend (3 days ago), no history of alcohol related seizures. Reports pain in the back of his head, intermittent jerking of arms and legs, patient denies recent illness including changes in bowel habits, nausea/vomiting. EMS reports vomiting and diarrhea.  EMS reports +urinary incontinence Patient admits to [facility] use Visiting from [facility].       Prior to Admission medications  Medication Sig Start Date End Date Taking? Authorizing Provider  ascorbic acid (VITAMIN C) 100 MG tablet Take 100 mg by mouth daily.    [provider]  citalopram  (CELEXA ) 20 MG tablet Take 1 tablet (20 mg total) by mouth daily. 12/31/21   Parsons, Brittney E, NP  Ergocalciferol (VITAMIN D2 PO) Take 1 tablet by mouth daily.    [provider]  hydrOXYzine  (ATARAX ) 10 MG tablet Take 1 tablet (10 mg total) by mouth 3 (three) times daily as needed. 12/31/21   Parsons, Brittney E, NP  Multiple Vitamin (MULTIVITAMIN WITH MINERALS) TABS tablet Take 1 tablet by mouth daily.    [provider]  Omega-3 1000 MG CAPS Take 1,000 mg by mouth daily.    [provider]  traZODone  (DESYREL ) 50 MG tablet Take 1 tablet (50 mg total) by mouth at bedtime as needed for sleep. 12/31/21   Harl Zane BRAVO, NP    Allergies: Patient has no known allergies.    Review of Systems Negative except as per HPI Updated Vital Signs BP (!) 137/113   Pulse (!) 112   Temp (!) 97.3  F (36.3 C) (Temporal)   Resp 19   Ht 6' 1 (1.854 m)   Wt 65.8 kg   SpO2 100%   BMI 19.13 kg/m   Physical Exam Vitals and nursing note reviewed.  Constitutional:      General: He is not in acute distress.    Appearance: He is well-developed and underweight. He is not diaphoretic.  HENT:     Head: Normocephalic and atraumatic.     Nose: Nose normal.     Mouth/Throat:     Mouth: Mucous membranes are moist.  Eyes:     Conjunctiva/sclera: Conjunctivae normal.     Pupils: Pupils are equal, round, and reactive to light.  Cardiovascular:     Rate and Rhythm: Regular rhythm. Tachycardia present.     Pulses: Normal pulses.     Heart sounds: Normal heart sounds.  Pulmonary:     Effort: Pulmonary effort is normal.     Breath sounds: Normal breath sounds.  Abdominal:     Palpations: Abdomen is soft.     Tenderness: There is no abdominal tenderness.  Musculoskeletal:     Cervical back: Tenderness present.     Right lower leg: No edema.     Left lower leg: No edema.  Skin:    General: Skin is warm and dry.  Neurological:     Mental Status: He is alert and oriented to person,  place, and time.     Comments: Intermittent twitching/spasms of the upper and lower extremities   Psychiatric:        Behavior: Behavior normal.     (all labs ordered are listed, but only abnormal results are displayed) Labs Reviewed  CBC WITH DIFFERENTIAL/PLATELET - Abnormal; Notable for the following components:      Result Value   WBC 12.4 (*)    RBC 3.17 (*)    Hemoglobin 11.3 (*)    HCT 30.3 (*)    MCH 35.6 (*)    MCHC 37.3 (*)    RDW 16.8 (*)    Neutro Abs 10.4 (*)    Monocytes Absolute 1.1 (*)    Abs Immature Granulocytes 0.10 (*)    All other components within normal limits  I-STAT CHEM 8, ED - Abnormal; Notable for the following components:   Sodium 132 (*)    Potassium 2.5 (*)    Chloride 95 (*)    BUN 50 (*)    Creatinine, Ser 1.40 (*)    Glucose, Bld 188 (*)    Calcium, Ion 1.09  (*)    Hemoglobin 11.9 (*)    HCT 35.0 (*)    All other components within normal limits  I-STAT CG4 LACTIC ACID, ED - Abnormal; Notable for the following components:   Lactic Acid, Venous 2.5 (*)    All other components within normal limits  CBG MONITORING, ED - Abnormal; Notable for the following components:   Glucose-Capillary 179 (*)    All other components within normal limits  RESP PANEL BY RT-PCR (RSV, FLU A&B, COVID)  [id number]  CULTURE, BLOOD (ROUTINE X 2)  CULTURE, BLOOD (ROUTINE X 2)  COMPREHENSIVE METABOLIC PANEL WITH GFR  ETHANOL  URINALYSIS, ROUTINE W REFLEX MICROSCOPIC  URINE DRUG SCREEN  MAGNESIUM   PROTIME-INR    EKG: EKG Interpretation Date/Time:  Wednesday January 04 2025 22:04:59 EST Ventricular Rate:  116 PR Interval:  143 QRS Duration:  81 QT Interval:  387 QTC Calculation: 538 R Axis:   83  Text Interpretation: Sinus tachycardia LVH with secondary repolarization abnormality Anterior infarct, old Prolonged QT interval Confirmed by Freddi Hamilton [phone]) on 01/04/2025 10:25:22 PM  Radiology: [facility] Chest Port 1 View Result Date: 01/04/2025 EXAM: 1 VIEW(S) XRAY OF THE CHEST 01/04/2025 10:58:27 PM COMPARISON: 12/28/2017 CLINICAL HISTORY: Questionable sepsis - evaluate for abnormality. FINDINGS: LUNGS AND PLEURA: Bibasilar airspace opacities, left greater than right, concerning for pneumonia. No pleural effusion. No pneumothorax. HEART AND MEDIASTINUM: No acute abnormality of the cardiac and mediastinal silhouettes. BONES AND SOFT TISSUES: No acute osseous abnormality. IMPRESSION: 1. Bibasilar airspace opacities, left greater than right, suspicious for pneumonia. Electronically signed by: Franky Crease MD 01/04/2025 11:01 PM EST RP Workstation: [id number]   CT Cervical Spine Wo Contrast Result Date: 01/04/2025 EXAM: CT CERVICAL SPINE WITHOUT CONTRAST 01/04/2025 10:35:03 PM TECHNIQUE: CT of the cervical spine was performed without the administration of intravenous  contrast. Multiplanar reformatted images are provided for review. Automated exposure control, iterative reconstruction, and/or weight based adjustment of the mA/kV was utilized to reduce the radiation dose to as low as reasonably achievable. COMPARISON: None available. CLINICAL HISTORY: Neck trauma, midline tenderness (Age 61-64y). FINDINGS: BONES AND ALIGNMENT: No acute fracture or traumatic malalignment. DEGENERATIVE CHANGES: No significant degenerative changes. SOFT TISSUES: No prevertebral soft tissue swelling. LUNGS: Mild paraseptal emphysematous changes at the lung apices. IMPRESSION: 1. No acute findings. 2. Emphysema (ICD10-J43.9). Electronically signed by: Pinkie Pebbles MD 01/04/2025 10:39 PM EST RP Workstation: [id number]  CT Head Wo Contrast Result Date: 01/04/2025 EXAM: CT HEAD WITHOUT CONTRAST 01/04/2025 10:35:03 PM TECHNIQUE: CT of the head was performed without the administration of intravenous contrast. Automated exposure control, iterative reconstruction, and/or weight based adjustment of the mA/kV was utilized to reduce the radiation dose to as low as reasonably achievable. COMPARISON: None available. CLINICAL HISTORY: Head trauma, repeat vomiting (Age 6-64y); seizure vs syncope, fall, head pain. FINDINGS: BRAIN AND VENTRICLES: No acute hemorrhage. No evidence of acute infarct. No hydrocephalus. No extra-axial collection. No mass effect or midline shift. ORBITS: No acute abnormality. SINUSES: No acute abnormality. SOFT TISSUES AND SKULL: No acute soft tissue abnormality. No skull fracture. IMPRESSION: 1. No acute intracranial abnormality. Electronically signed by: Pinkie Pebbles MD 01/04/2025 10:38 PM EST RP Workstation: [id number]    {Document cardiac monitor, telemetry assessment procedure when appropriate:32947} Procedures   Medications Ordered in the ED  magnesium  sulfate IVPB 2 g 50 mL (2 g Intravenous New Bag/Given 01/04/25 2258)  potassium chloride  10 mEq in 100 mL IVPB (10  mEq Intravenous New Bag/Given 01/04/25 2243)  sodium chloride  0.9 % bolus 1,000 mL (1,000 mLs Intravenous New Bag/Given 01/04/25 2242)      {Click here for ABCD2, HEART and other calculators REFRESH Note before signing:1}                              Medical Decision Making Amount and/or Complexity of Data Reviewed Labs: ordered. Radiology: ordered.   This patient presents to the ED for concern of ***, this involves an extensive number of treatment options, and is a complaint that carries with it a high risk of complications and morbidity.  The differential diagnosis includes ***   Co morbidities / Chronic conditions that complicate the patient evaluation  ***   Additional history obtained:  Additional history obtained from EMR External records from outside source obtained and reviewed including ***   Lab Tests:  I Ordered, and personally interpreted labs.  The pertinent results include:  ***   Imaging Studies ordered:  I ordered imaging studies including ***  I independently visualized and interpreted imaging which showed *** I agree with the radiologist interpretation   Cardiac Monitoring: / EKG:  The patient was maintained on a cardiac monitor.  I personally viewed and interpreted the cardiac monitored which showed an underlying rhythm of: ***   Problem List / ED Course / Critical interventions / Medication management  *** I ordered medication including ***   Reevaluation of the patient after these medicines showed that the patient *** I have reviewed the patients home medicines and have made adjustments as needed   Consultations Obtained:  I requested consultation with the ***,  and discussed lab and imaging findings as well as pertinent plan - they recommend: ***   Social Determinants of Health:  ***   Test / Admission - Considered:  ***   {Document critical care time when appropriate  Document review of labs and clinical decision tools ie  CHADS2VASC2, etc  Document your independent review of radiology images and any outside records  Document your discussion with family members, caretakers and with consultants  Document social determinants of health affecting pt's care  Document your decision making why or why not admission, treatments were needed:32947:::1}   Final diagnoses:  None    ED Discharge Orders     None        "

## 2025-01-05 ENCOUNTER — Inpatient Hospital Stay (HOSPITAL_COMMUNITY): Payer: Self-pay

## 2025-01-05 ENCOUNTER — Encounter (HOSPITAL_COMMUNITY): Payer: Self-pay | Admitting: Family Medicine

## 2025-01-05 DIAGNOSIS — R7401 Elevation of levels of liver transaminase levels: Secondary | ICD-10-CM | POA: Insufficient documentation

## 2025-01-05 DIAGNOSIS — R569 Unspecified convulsions: Secondary | ICD-10-CM

## 2025-01-05 DIAGNOSIS — R9431 Abnormal electrocardiogram [ECG] [EKG]: Secondary | ICD-10-CM | POA: Diagnosis present

## 2025-01-05 DIAGNOSIS — J189 Pneumonia, unspecified organism: Secondary | ICD-10-CM | POA: Diagnosis present

## 2025-01-05 DIAGNOSIS — R55 Syncope and collapse: Secondary | ICD-10-CM

## 2025-01-05 DIAGNOSIS — F1020 Alcohol dependence, uncomplicated: Secondary | ICD-10-CM

## 2025-01-05 DIAGNOSIS — E876 Hypokalemia: Secondary | ICD-10-CM | POA: Diagnosis present

## 2025-01-05 LAB — CBC
HCT: 27.8 % — ABNORMAL LOW (ref 39.0–52.0)
Hemoglobin: 10.2 g/dL — ABNORMAL LOW (ref 13.0–17.0)
MCH: 35.9 pg — ABNORMAL HIGH (ref 26.0–34.0)
MCHC: 36.7 g/dL — ABNORMAL HIGH (ref 30.0–36.0)
MCV: 97.9 fL (ref 80.0–100.0)
Platelets: 357 K/uL (ref 150–400)
RBC: 2.84 MIL/uL — ABNORMAL LOW (ref 4.22–5.81)
RDW: 17 % — ABNORMAL HIGH (ref 11.5–15.5)
WBC: 10.7 K/uL — ABNORMAL HIGH (ref 4.0–10.5)
nRBC: 0.3 % — ABNORMAL HIGH (ref 0.0–0.2)

## 2025-01-05 LAB — CBG MONITORING, ED: Glucose-Capillary: 134 mg/dL — ABNORMAL HIGH (ref 70–99)

## 2025-01-05 LAB — URINE DRUG SCREEN
Amphetamines: NEGATIVE
Barbiturates: NEGATIVE
Benzodiazepines: NEGATIVE
Cocaine: NEGATIVE
Fentanyl: NEGATIVE
Methadone Scn, Ur: NEGATIVE
Opiates: NEGATIVE
Tetrahydrocannabinol: POSITIVE — AB

## 2025-01-05 LAB — COMPREHENSIVE METABOLIC PANEL WITH GFR
ALT: 30 U/L (ref 0–44)
AST: 59 U/L — ABNORMAL HIGH (ref 15–41)
Albumin: 3.5 g/dL (ref 3.5–5.0)
Alkaline Phosphatase: 134 U/L — ABNORMAL HIGH (ref 38–126)
Anion gap: 17 — ABNORMAL HIGH (ref 5–15)
BUN: 35 mg/dL — ABNORMAL HIGH (ref 6–20)
CO2: 21 mmol/L — ABNORMAL LOW (ref 22–32)
Calcium: 9.1 mg/dL (ref 8.9–10.3)
Chloride: 96 mmol/L — ABNORMAL LOW (ref 98–111)
Creatinine, Ser: 1 mg/dL (ref 0.61–1.24)
GFR, Estimated: >60 mL/min
Glucose, Bld: 138 mg/dL — ABNORMAL HIGH (ref 70–99)
Potassium: 3 mmol/L — ABNORMAL LOW (ref 3.5–5.1)
Sodium: 134 mmol/L — ABNORMAL LOW (ref 135–145)
Total Bilirubin: 0.5 mg/dL (ref 0.0–1.2)
Total Protein: 6.5 g/dL (ref 6.5–8.1)

## 2025-01-05 LAB — ECHOCARDIOGRAM COMPLETE
Area-P 1/2: 4.86 cm2
Calc EF: 44.2 %
Height: 73 in
S' Lateral: 3.4 cm
Single Plane A2C EF: 35 %
Single Plane A4C EF: 50 %
Weight: 2320 [oz_av]

## 2025-01-05 LAB — TSH: TSH: 1.13 u[IU]/mL (ref 0.350–4.500)

## 2025-01-05 LAB — MAGNESIUM: Magnesium: 2.2 mg/dL (ref 1.7–2.4)

## 2025-01-05 LAB — FOLATE: Folate: 16.8 ng/mL

## 2025-01-05 LAB — I-STAT CG4 LACTIC ACID, ED: Lactic Acid, Venous: 0.7 mmol/L (ref 0.5–1.9)

## 2025-01-05 LAB — PHOSPHORUS: Phosphorus: 2.5 mg/dL (ref 2.5–4.6)

## 2025-01-05 LAB — HIV ANTIBODY (ROUTINE TESTING W REFLEX): HIV Screen 4th Generation wRfx: NONREACTIVE

## 2025-01-05 LAB — VITAMIN B12: Vitamin B-12: 692 pg/mL (ref 180–914)

## 2025-01-05 MED ORDER — LORAZEPAM 2 MG/ML IJ SOLN: 0.0000 mg | Freq: Three times a day (TID) | INTRAMUSCULAR | Status: DC

## 2025-01-05 MED ORDER — LACTATED RINGERS IV SOLN: INTRAVENOUS | Status: DC

## 2025-01-05 MED ORDER — ENOXAPARIN SODIUM 40 MG/0.4ML IJ SOSY
40.0000 mg | PREFILLED_SYRINGE | INTRAMUSCULAR | Status: DC
  Administered 2025-01-05 – 2025-01-12 (×8): 40 mg via SUBCUTANEOUS
  Filled 2025-01-05 (×8): qty 0.4

## 2025-01-05 MED ORDER — LORAZEPAM 2 MG/ML IJ SOLN: 1.0000 mg | INTRAMUSCULAR | Status: AC | PRN

## 2025-01-05 MED ORDER — SODIUM CHLORIDE 0.9 % IV SOLN
100.0000 mg | Freq: Two times a day (BID) | INTRAVENOUS | Status: DC
  Administered 2025-01-05 – 2025-01-07 (×5): 100 mg via INTRAVENOUS
  Filled 2025-01-05 (×6): qty 100

## 2025-01-05 MED ORDER — POTASSIUM CHLORIDE CRYS ER 20 MEQ PO TBCR: 40.0000 meq | EXTENDED_RELEASE_TABLET | Freq: Once | ORAL | Status: DC

## 2025-01-05 MED ORDER — FOLIC ACID 1 MG PO TABS
1.0000 mg | ORAL_TABLET | Freq: Every day | ORAL | Status: DC
  Administered 2025-01-05 – 2025-01-12 (×8): 1 mg via ORAL
  Filled 2025-01-05 (×8): qty 1

## 2025-01-05 MED ORDER — ACETAMINOPHEN 325 MG PO TABS
650.0000 mg | ORAL_TABLET | Freq: Four times a day (QID) | ORAL | Status: DC | PRN
  Administered 2025-01-06: 650 mg via ORAL
  Filled 2025-01-05: qty 2

## 2025-01-05 MED ORDER — PROCHLORPERAZINE EDISYLATE 10 MG/2ML IJ SOLN
5.0000 mg | Freq: Four times a day (QID) | INTRAMUSCULAR | Status: DC | PRN
  Administered 2025-01-05: 5 mg via INTRAVENOUS
  Filled 2025-01-05: qty 2

## 2025-01-05 MED ORDER — SODIUM CHLORIDE 0.9 % IV SOLN
2.0000 g | INTRAVENOUS | Status: AC
  Administered 2025-01-05 – 2025-01-08 (×4): 2 g via INTRAVENOUS
  Filled 2025-01-05 (×4): qty 20

## 2025-01-05 MED ORDER — POTASSIUM CHLORIDE CRYS ER 20 MEQ PO TBCR
40.0000 meq | EXTENDED_RELEASE_TABLET | Freq: Once | ORAL | Status: AC
  Administered 2025-01-05: 40 meq via ORAL
  Filled 2025-01-05: qty 2

## 2025-01-05 MED ORDER — SENNOSIDES-DOCUSATE SODIUM 8.6-50 MG PO TABS: 1.0000 | ORAL_TABLET | Freq: Every evening | ORAL | Status: DC | PRN

## 2025-01-05 MED ORDER — ACETAMINOPHEN 650 MG RE SUPP: 650.0000 mg | Freq: Four times a day (QID) | RECTAL | Status: DC | PRN

## 2025-01-05 MED ORDER — LORAZEPAM 2 MG/ML IJ SOLN
0.0000 mg | INTRAMUSCULAR | Status: DC
  Administered 2025-01-05: 2 mg via INTRAVENOUS
  Administered 2025-01-05 (×2): 1 mg via INTRAVENOUS
  Filled 2025-01-05 (×3): qty 1

## 2025-01-05 MED ORDER — SODIUM CHLORIDE 0.9% FLUSH
3.0000 mL | Freq: Two times a day (BID) | INTRAVENOUS | Status: DC
  Administered 2025-01-05 – 2025-01-11 (×13): 3 mL via INTRAVENOUS

## 2025-01-05 MED ORDER — LORAZEPAM 1 MG PO TABS: 1.0000 mg | ORAL_TABLET | ORAL | Status: AC | PRN

## 2025-01-05 MED ORDER — THIAMINE MONONITRATE 100 MG PO TABS
100.0000 mg | ORAL_TABLET | Freq: Every day | ORAL | Status: DC
  Administered 2025-01-05 – 2025-01-12 (×8): 100 mg via ORAL
  Filled 2025-01-05 (×8): qty 1

## 2025-01-05 MED ORDER — ADULT MULTIVITAMIN W/MINERALS CH
1.0000 | ORAL_TABLET | Freq: Every day | ORAL | Status: DC
  Administered 2025-01-05 – 2025-01-12 (×8): 1 via ORAL
  Filled 2025-01-05 (×8): qty 1

## 2025-01-05 MED ORDER — THIAMINE HCL 100 MG/ML IJ SOLN: 100.0000 mg | Freq: Every day | INTRAMUSCULAR | Status: DC

## 2025-01-05 NOTE — Progress Notes (Signed)
 " PROGRESS NOTE    Damon Olson  FMW:[id number] DOB: 06-11-1976 DOA: 01/04/2025 PCP: Pcp, No   Brief Narrative: 49 year old with past medical history significant for depression, anxiety, alcohol abuse presented after loss of consciousness.  Patient reports a history of nausea vomiting lower abdominal pain on 1/17 symptoms resolved by 1/19.  He normally drinks a sixpack of beer every night after work, but has not had any alcohol in 3 days.  Denies any history of seizures.  Per EMS report patient was incontinent in urine.  Evaluation in the ED lactic acid 2.5, white blood cell 12, creatinine 1.5, magnesium  1.6 potassium 2.6.  CT head and cervical spine no acute findings.  Chest x-ray bibasilar airspace opacity concerning for pneumonia.  Assessment & Plan:   Principal Problem:   Transient LOC (loss of consciousness) Active Problems:   Unspecified mood (affective) disorder   Hypokalemia   Hypomagnesemia   Pneumonia   Prolonged QT interval  1-Syncope versus Seizure - Presented after brief loss of consciousness, had urinary incontinence per EMS. -suspect Syncope in setting hypotension and severe hypokalemia.  -FU ECHO: abnormal ECHO EF 45--50 %, regional wall motion abnormalities. Will consult cardiology  -Continue with IV fluids. Replete electrolytes.  MRI   Pneumonia: - He reported productive cough, SIRS criteria, chest x-ray bibasilar infiltrates, leukocytosis white blood cell 12. -COVID RSV influenza negative. -Blood cultures no growth to date. - Continue IV antibiotics  Alcohol abuse: - Monitor on CIWA  Hypokalemia, hypomagnesemia - Received IV and oral supplement.  -Mg at 2.  -Continue to replete K   Prolonged QT: -Correct electrolyte  Weakness; generalized. FTT,   -check B12, TSH, Thiamine  -nutrition consultation      Estimated body mass index is 19.13 kg/m as calculated from the following:   Height as of this encounter: 6' 1 (1.854 m).   Weight as of this  encounter: 65.8 kg.   DVT prophylaxis: Lovenox  Code Status: Full code Family Communication: care dicussed with patient.  Disposition Plan:  Status is: Inpatient Remains inpatient appropriate because: management of syncope.     Consultants:  Cardiology  Procedures:  ECHO  Antimicrobials:    Subjective: He is alert, appears frail, weak.  Report generalized weakness worse LE>  Report some cough.  He is visiting here from [place] .   Objective: Vitals:   01/05/25 0600 01/05/25 0645 01/05/25 0730 01/05/25 0800  BP: (!) 132/94 (!) 131/94 (!) 139/94 (!) 140/96  Pulse: 92 91 91 88  Resp: 18 17 (!) 21 19  Temp:      TempSrc:      SpO2: 100% 100% 100% 100%  Weight:      Height:        Intake/Output Summary (Last 24 hours) at 01/05/2025 0818 Last data filed at 01/05/2025 0144 Gross per 24 hour  Intake 2446.18 ml  Output 150 ml  Net 2296.18 ml   Filed Weights   01/04/25 2205  Weight: 65.8 kg    Examination:  General exam: Appears calm and comfortable  Respiratory system: Clear to auscultation. Respiratory effort normal. Cardiovascular system: S1 & S2 heard, RRR. No JVD, murmurs, rubs, gallops or clicks. No pedal edema. Gastrointestinal system: Abdomen is nondistended, soft and nontender. No organomegaly or masses felt. Normal bowel sounds heard. Central nervous system: Alert and oriented. No focal neurological deficits. Extremities: Symmetric 5 x 5 power.    Data Reviewed: I have personally reviewed following labs and imaging studies  CBC: Recent Labs  Lab 01/04/25 2207 01/04/25  2223 01/05/25 0525  WBC 12.4*  --  10.7*  NEUTROABS 10.4*  --   --   HGB 11.3* 11.9* 10.2*  HCT 30.3* 35.0* 27.8*  MCV 95.6  --  97.9  PLT 389  --  357   Basic Metabolic Panel: Recent Labs  Lab 01/04/25 2207 01/04/25 2223 01/05/25 0525  NA 132* 132* 134*  K 2.6* 2.5* 3.0*  CL 91* 95* 96*  CO2 23  --  21*  GLUCOSE 181* 188* 138*  BUN 45* 50* 35*  CREATININE 1.45* 1.40*  1.00  CALCIUM 9.7  --  9.1  MG 1.6*  --  2.2  PHOS  --   --  2.5   GFR: Estimated Creatinine Clearance: 85 mL/min (by C-G formula based on SCr of 1 mg/dL). Liver Function Tests: Recent Labs  Lab 01/04/25 2207 01/05/25 0525  AST 74* 59*  ALT 36 30  ALKPHOS 130* 134*  BILITOT 0.7 0.5  PROT 7.3 6.5  ALBUMIN 3.9 3.5   No results for input(s): LIPASE, AMYLASE in the last 168 hours. No results for input(s): AMMONIA in the last 168 hours. Coagulation Profile: Recent Labs  Lab 01/04/25 2252  INR 1.0   Cardiac Enzymes: No results for input(s): CKTOTAL, CKMB, CKMBINDEX, TROPONINI in the last 168 hours. BNP (last 3 results) No results for input(s): PROBNP in the last 8760 hours. HbA1C: No results for input(s): HGBA1C in the last 72 hours. CBG: Recent Labs  Lab 01/04/25 2240 01/05/25 0536  GLUCAP 179* 134*   Lipid Profile: No results for input(s): CHOL, HDL, LDLCALC, TRIG, CHOLHDL, LDLDIRECT in the last 72 hours. Thyroid Function Tests: No results for input(s): TSH, T4TOTAL, FREET4, T3FREE, THYROIDAB in the last 72 hours. Anemia Panel: No results for input(s): VITAMINB12, FOLATE, FERRITIN, TIBC, IRON, RETICCTPCT in the last 72 hours. Sepsis Labs: Recent Labs  Lab 01/04/25 2220 01/05/25 0026  LATICACIDVEN 2.5* 0.7    Recent Results (from the past 240 hours)  Resp panel by RT-PCR (RSV, Flu A&B, Covid) Urine, Clean Catch     Status: None   Collection Time: 01/04/25 10:17 PM   Specimen: Urine, Clean Catch; Nasal Swab  Result Value Ref Range Status   SARS Coronavirus 2 by RT PCR NEGATIVE NEGATIVE Final   Influenza A by PCR NEGATIVE NEGATIVE Final   Influenza B by PCR NEGATIVE NEGATIVE Final    Comment: (NOTE) The Xpert Xpress SARS-CoV-2/FLU/RSV plus assay is intended as an aid in the diagnosis of influenza from Nasopharyngeal swab specimens and should not be used as a sole basis for treatment. Nasal washings  and aspirates are unacceptable for Xpert Xpress SARS-CoV-2/FLU/RSV testing.  Fact Sheet for Patients: bloggercourse.com  Fact Sheet for Healthcare Providers: seriousbroker.it  This test is not yet approved or cleared by the [place]  FDA and has been authorized for detection and/or diagnosis of SARS-CoV-2 by FDA under an Emergency Use Authorization (EUA). This EUA will remain in effect (meaning this test can be used) for the duration of the COVID-19 declaration under Section 564(b)(1) of the Act, 21 U.S.C. section 360bbb-3(b)(1), unless the authorization is terminated or revoked.     Resp Syncytial Virus by PCR NEGATIVE NEGATIVE Final    Comment: (NOTE) Fact Sheet for Patients: bloggercourse.com  Fact Sheet for Healthcare Providers: seriousbroker.it  This test is not yet approved or cleared by the [place]  FDA and has been authorized for detection and/or diagnosis of SARS-CoV-2 by FDA under an Emergency Use Authorization (EUA). This EUA will remain in  effect (meaning this test can be used) for the duration of the COVID-19 declaration under Section 564(b)(1) of the Act, 21 U.S.C. section 360bbb-3(b)(1), unless the authorization is terminated or revoked.  Performed at [facility], [address] [place], [place] [address]   Culture, blood (routine x 2)     Status: None (Preliminary result)   Collection Time: 01/04/25 10:52 PM   Specimen: BLOOD LEFT FOREARM  Result Value Ref Range Status   Specimen Description BLOOD LEFT FOREARM  Final   Special Requests   Final    BOTTLES DRAWN AEROBIC AND ANAEROBIC Blood Culture results may not be optimal due to an inadequate volume of blood received in culture bottles   Culture   Final    NO GROWTH < 12 HOURS Performed at [facility], [address] [place], [place] [address]    Report Status PENDING  Incomplete   Culture, blood (routine x 2)     Status: None (Preliminary result)   Collection Time: 01/04/25 10:55 PM   Specimen: BLOOD  Result Value Ref Range Status   Specimen Description BLOOD SITE NOT SPECIFIED  Final   Special Requests   Final    BOTTLES DRAWN AEROBIC AND ANAEROBIC Blood Culture adequate volume   Culture   Final    NO GROWTH < 12 HOURS Performed at [facility], [address], [place], [place] [address]    Report Status PENDING  Incomplete         Radiology Studies: DG Chest Port 1 View Result Date: 01/04/2025 EXAM: 1 VIEW(S) XRAY OF THE CHEST 01/04/2025 10:58:27 PM COMPARISON: 12/28/2017 CLINICAL HISTORY: Questionable sepsis - evaluate for abnormality. FINDINGS: LUNGS AND PLEURA: Bibasilar airspace opacities, left greater than right, concerning for pneumonia. No pleural effusion. No pneumothorax. HEART AND MEDIASTINUM: No acute abnormality of the cardiac and mediastinal silhouettes. BONES AND SOFT TISSUES: No acute osseous abnormality. IMPRESSION: 1. Bibasilar airspace opacities, left greater than right, suspicious for pneumonia. Electronically signed by: Franky Crease MD 01/04/2025 11:01 PM EST RP Workstation: [id number]   CT Cervical Spine Wo Contrast Result Date: 01/04/2025 EXAM: CT CERVICAL SPINE WITHOUT CONTRAST 01/04/2025 10:35:03 PM TECHNIQUE: CT of the cervical spine was performed without the administration of intravenous contrast. Multiplanar reformatted images are provided for review. Automated exposure control, iterative reconstruction, and/or weight based adjustment of the mA/kV was utilized to reduce the radiation dose to as low as reasonably achievable. COMPARISON: None available. CLINICAL HISTORY: Neck trauma, midline tenderness (Age 29-64y). FINDINGS: BONES AND ALIGNMENT: No acute fracture or traumatic malalignment. DEGENERATIVE CHANGES: No significant degenerative changes. SOFT TISSUES: No prevertebral soft tissue swelling. LUNGS: Mild paraseptal emphysematous  changes at the lung apices. IMPRESSION: 1. No acute findings. 2. Emphysema (ICD10-J43.9). Electronically signed by: Pinkie Pebbles MD 01/04/2025 10:39 PM EST RP Workstation: [id number]   CT Head Wo Contrast Result Date: 01/04/2025 EXAM: CT HEAD WITHOUT CONTRAST 01/04/2025 10:35:03 PM TECHNIQUE: CT of the head was performed without the administration of intravenous contrast. Automated exposure control, iterative reconstruction, and/or weight based adjustment of the mA/kV was utilized to reduce the radiation dose to as low as reasonably achievable. COMPARISON: None available. CLINICAL HISTORY: Head trauma, repeat vomiting (Age 59-64y); seizure vs syncope, fall, head pain. FINDINGS: BRAIN AND VENTRICLES: No acute hemorrhage. No evidence of acute infarct. No hydrocephalus. No extra-axial collection. No mass effect or midline shift. ORBITS: No acute abnormality. SINUSES: No acute abnormality. SOFT TISSUES AND SKULL: No acute soft tissue abnormality. No skull fracture. IMPRESSION:  1. No acute intracranial abnormality. Electronically signed by: Pinkie Pebbles MD 01/04/2025 10:38 PM EST RP Workstation: [id number]        Scheduled Meds:  enoxaparin  (LOVENOX ) injection  40 mg Subcutaneous Q24H   folic acid   1 mg Oral Daily   LORazepam   0-4 mg Intravenous Q4H   Followed by   NOREEN ON 01/07/2025] LORazepam   0-4 mg Intravenous Q8H   multivitamin with minerals  1 tablet Oral Daily   sodium chloride  flush  3 mL Intravenous Q12H   thiamine   100 mg Oral Daily   Or   thiamine   100 mg Intravenous Daily   Continuous Infusions:  cefTRIAXone  (ROCEPHIN )  IV     doxycycline  (VIBRAMYCIN ) IV     lactated ringers  125 mL/hr at 01/05/25 0259     LOS: 0 days    Time spent: 35 Minutes    Kathlynn Swofford A Benjamin Merrihew, MD [facility]   If 7PM-7AM, please contact night-coverage [email or web address]  01/05/2025, 8:18 AM   "

## 2025-01-05 NOTE — Consult Note (Addendum)
 "  Cardiology Consultation   Patient ID: Damon Olson MRN: [id number]; DOB: 05/10/1976  Admit date: 01/04/2025 Date of Consult: 01/05/2025  PCP:  Damon Olson   [facility] Providers Cardiologist:  None     Patient Profile: Damon Olson is a 49 y.o. male with a hx of anxiety, depression, alcohol abuse,  who is being seen 01/05/2025 for the evaluation of syncope  at the request of Damon Olson.  History of Present Illness: Damon Olson with above PMH who presented to ER last night for syncope. Patient was walking around in the convenience store at a gas station yesterday. He suddenly passed out and EMS was called. He has Olson recollection of the event. He states he remembers walking out of his care last. He does not recall having any chest pain, dizziness, SOB prior to the event. He states he was having a normal day.  He woke up and saw EMS. He was urinary incontinent at the time. He states he has pain of his neck and back and legs since the fall. He drinks ETOH on regular basis, average 6 packs of beer nightly or more, last drink was on Sunday. He never had hx of seizure. He denied having any ETOH withdrawal symptoms.  He has Olson cardiac issues in the past. He smokes cigarettes average 2 cigars daily. He smokes marijuana. He states his mother had MI at age of 5s.  He denied any historical SOB, weight gain, leg edema. He states he is quite active in life.   Per ER work up 01/04/25, CMP with Na 132, K 2.6, BUN 45, Cr 1.45, GFR 60, alka phos 130, anion gap 19. Mag 1.6. Lactic acid 2.5. CBC diff with leukocytosis 12400, Hgb 11.3. Resp viral panel negative. Blood culture x2 sent.  CXR showed  Bibasilar airspace opacities, left greater than right, suspicious for pneumonia. CT C spine and head without showed Olson acute fracture or ICH. He was given IVF and antibiotic for pneumonia at ED. He was admitted to hospitalist, placed on CIWA protocol. Echo today showed LVEF 45-50%, normal diastolic parameters, normal  RV. Cardiology is consulted now for further evaluation. Labs today showed K 3, bicarb 21, Cr 1, BUN35, GFR >60. Lactic acid 0.7. WBC 10700. Hgb 10.2. INR 1.     Past Medical History:  Diagnosis Date   Anxiety    Depression    Marijuana use 05/06/2021   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HCC) 05/06/2021   Panic disorder 05/06/2021   Unspecified mood (affective) disorder 05/06/2021    History reviewed. Olson pertinent surgical history.   Home Medications:  Prior to Admission medications  Medication Sig Start Date End Date Taking? Authorizing Provider  albuterol (VENTOLIN HFA) 108 (90 Base) MCG/ACT inhaler Inhale 2 puffs into the lungs every 6 (six) hours as needed for wheezing or shortness of breath.   Yes [provider]  Multiple Vitamin (MULTIVITAMIN WITH MINERALS) TABS tablet Take 1 tablet by mouth daily.   Yes [provider]    Scheduled Meds:  enoxaparin  (LOVENOX ) injection  40 mg Subcutaneous Q24H   folic acid   1 mg Oral Daily   LORazepam   0-4 mg Intravenous Q4H   Followed by   Damon Olson ON 01/07/2025] LORazepam   0-4 mg Intravenous Q8H   multivitamin with minerals  1 tablet Oral Daily   potassium chloride   40 mEq Oral Once   sodium chloride  flush  3 mL Intravenous Q12H   thiamine   100 mg Oral Daily  Or   thiamine   100 mg Intravenous Daily   Continuous Infusions:  cefTRIAXone  (ROCEPHIN )  IV Stopped (01/05/25 0946)   doxycycline  (VIBRAMYCIN ) IV Stopped (01/05/25 1207)   lactated ringers  100 mL/hr at 01/05/25 0953   PRN Meds: acetaminophen  **OR** acetaminophen , LORazepam  **OR** LORazepam , prochlorperazine , senna-docusate  Allergies:   Allergies[1]  Social History:   Social History   Socioeconomic History   Marital status: Significant Other    Spouse name: Not on file   Number of children: Not on file   Years of education: Not on file   Highest education level: Not on file  Occupational History   Not on file  Tobacco Use   Smoking status: Every Day     Current packs/day: 1.00    Average packs/day: 1 pack/day for 15.0 years (15.0 ttl pk-yrs)    Types: Cigarettes   Smokeless tobacco: Never  Substance and Sexual Activity   Alcohol use: Yes    Comment: 3-4 glasses wine/beer every other day   Drug use: Olson   Sexual activity: Not on file  Other Topics Concern   Not on file  Social History Narrative   Not on file   Social Drivers of Health   Tobacco Use: High Risk (01/05/2025)   Patient History    Smoking Tobacco Use: Every Day    Smokeless Tobacco Use: Never    Passive Exposure: Not on file  Financial Resource Strain: Not on file  Food Insecurity: Olson Food Insecurity (01/05/2025)   Epic    Worried About Programme Researcher, Broadcasting/film/video in the Last Year: Never true    Ran Out of Food in the Last Year: Never true  Transportation Needs: Not on file  Physical Activity: Not on file  Stress: Not on file  Social Connections: Not on file  Intimate Partner Violence: Not on file  Depression (PHQ2-9): Medium Risk (12/31/2021)   Depression (PHQ2-9)    PHQ-2 Score: 7  Alcohol Screen: Not on file  Housing: Not on file  Utilities: Not on file  Health Literacy: Not on file    Family History:   Mother: MI, CVA, seizure   ROS:  Constitutional: Denied fever, chills, malaise, night sweats Eyes: Denied vision change or loss Ears/Nose/Mouth/Throat: Denied ear ache, sore throat, coughing, sinus pain Cardiovascular: see HPI  Respiratory: see HPI  Gastrointestinal: Denied nausea, vomiting, abdominal pain, diarrhea Genital/Urinary: Denied dysuria, hematuria, urinary frequency/urgency Musculoskeletal: Denied muscle ache, joint pain, weakness Skin: Denied rash, wound Neuro: Denied headache, dizziness, syncope Psych: history of depression/anxiety  Endocrine: Denied history of diabetes   Physical Exam/Data: Vitals:   01/05/25 0841 01/05/25 0845 01/05/25 0900 01/05/25 1000  BP:  (!) 120/92 (!) 130/94 (!) 133/93  Pulse:  94 (!) 106 100  Resp:  (!) 21 19 17    Temp: (!) 97.2 F (36.2 C)     TempSrc: Temporal     SpO2:  100% 100% 100%  Weight:      Height:        Intake/Output Summary (Last 24 hours) at 01/05/2025 1608 Last data filed at 01/05/2025 0953 Gross per 24 hour  Intake 3300.16 ml  Output 150 ml  Net 3150.16 ml      01/04/2025   10:05 PM 05/06/2021    3:25 PM 05/05/2021    8:22 PM  Last 3 Weights  Weight (lbs) 145 lb  134 lb  Weight (kg) 65.772 kg  60.782 kg     Information is confidential and restricted. Go to Review Flowsheets to  unlock data.     Body mass index is 19.13 kg/m.   Vitals:  Vitals:   01/05/25 0900 01/05/25 1000  BP: (!) 130/94 (!) 133/93  Pulse: (!) 106 100  Resp: 19 17  Temp:    SpO2: 100% 100%   General Appearance: In Olson apparent distress, laying in bed, well nourished  HEENT: Normocephalic, atraumatic.  Neck: Supple, trachea midline, Olson JVDs Cardiovascular: Regular rate and rhythm, normal S1-S2,  Olson murmur/rub/gallop, S3/S4 Respiratory: Resting breathing unlabored, lungs sounds clear to auscultation bilaterally, Olson use of accessory muscles. On room air.  Olson wheezes, rales or rhonchi.   Gastrointestinal: Bowel sounds positive, abdomen soft, non-distended.  Extremities: Able to move all extremities in bed without difficulty, Olson BLE edema  Musculoskeletal: Normal muscle bulk and tone Skin: Intact, warm, dry. Olson rashes  Neurologic: Alert, oriented to person, place and time. Fluent speech, Olson cognitive deficit, Olson gross focal neuro deficit Psychiatric: Normal affect. Mood is appropriate.    EKG:  The EKG was personally reviewed and demonstrates:    EKG today showed sinus tachycardia 116bpm, LVH, anterior Q, manual QT  msec  Telemetry:  Telemetry was personally reviewed and demonstrates:    He is not on telemetry at this time   Relevant CV Studies:   Echo from today:   1. Left ventricular ejection fraction, by estimation, is 45 to 50%. The  left ventricle has mildly decreased function. The  left ventricle  demonstrates regional wall motion abnormalities (see scoring  diagram/findings for description). Left ventricular  diastolic parameters were normal.   2. Right ventricular systolic function is normal. The right ventricular  size is normal. Tricuspid regurgitation signal is inadequate for assessing  PA pressure.   3. The mitral valve is grossly normal. Olson evidence of mitral valve  regurgitation. Olson evidence of mitral stenosis.   4. The aortic valve is tricuspid. Aortic valve regurgitation is not  visualized. Olson aortic stenosis is present.   5. The inferior vena cava is normal in size with greater than 50%  respiratory variability, suggesting right atrial pressure of 3 mmHg.   Comparison(s): Olson prior Echocardiogram.    Laboratory Data: High Sensitivity Troponin:  Olson results for input(s): TROPONINIHS in the last 720 hours. Olson results for input(s): TRNPT in the last 720 hours.    Chemistry Recent Labs  Lab 01/04/25 2207 01/04/25 2223 01/05/25 0525  NA 132* 132* 134*  K 2.6* 2.5* 3.0*  CL 91* 95* 96*  CO2 23  --  21*  GLUCOSE 181* 188* 138*  BUN 45* 50* 35*  CREATININE 1.45* 1.40* 1.00  CALCIUM 9.7  --  9.1  MG 1.6*  --  2.2  GFRNONAA 60*  --  >60  ANIONGAP 19*  --  17*    Recent Labs  Lab 01/04/25 2207 01/05/25 0525  PROT 7.3 6.5  ALBUMIN 3.9 3.5  AST 74* 59*  ALT 36 30  ALKPHOS 130* 134*  BILITOT 0.7 0.5   Lipids Olson results for input(s): CHOL, TRIG, HDL, LABVLDL, LDLCALC, CHOLHDL in the last 168 hours.  Hematology Recent Labs  Lab 01/04/25 2207 01/04/25 2223 01/05/25 0525  WBC 12.4*  --  10.7*  RBC 3.17*  --  2.84*  HGB 11.3* 11.9* 10.2*  HCT 30.3* 35.0* 27.8*  MCV 95.6  --  97.9  MCH 35.6*  --  35.9*  MCHC 37.3*  --  36.7*  RDW 16.8*  --  17.0*  PLT 389  --  357  Thyroid Olson results for input(s): TSH, FREET4 in the last 168 hours.  BNPNo results for input(s): BNP, PROBNP in the last 168 hours.  DDimer Olson results  for input(s): DDIMER in the last 168 hours.  Radiology/Studies:  EEG adult Result Date: 25-Jan-2025 Shelton Arlin KIDD, MD     01/25/25  3:16 PM Patient Name: Damon Olson MRN: [id number] Epilepsy Attending: Arlin KIDD Shelton Referring Physician/Provider: Madelyne Owen LABOR, MD Date: 2025-01-25 Duration: 26.03 mins Patient history: 49yo M with transient LOC. EEG to evaluate for seizure Level of alertness: Awake, asleep AEDs during EEG study: Ativan  Technical aspects: This EEG study was done with scalp electrodes positioned according to the 10-20 International system of electrode placement. Electrical activity was reviewed with band pass filter of 1-70Hz , sensitivity of 7 uV/mm, display speed of 41mm/sec with a 60Hz  notched filter applied as appropriate. EEG data were recorded continuously and digitally stored.  Video monitoring was available and reviewed as appropriate. Description: The posterior dominant rhythm consists of 9 Hz activity of moderate voltage (25-35 uV) seen predominantly in posterior head regions, symmetric and reactive to eye opening and eye closing. Sleep was characterized by vertex waves, sleep spindles (12 to 14 Hz), maximal frontocentral region. Hyperventilation and photic stimulation were not performed.   IMPRESSION: This study is within normal limits. Olson seizures or epileptiform discharges were seen throughout the recording. A normal interictal EEG does not exclude the diagnosis of epilepsy. Arlin KIDD Shelton   ECHOCARDIOGRAM COMPLETE Result Date: 01-25-25    ECHOCARDIOGRAM REPORT   Patient Name:   Damon Olson Date of Exam: 01-25-2025 Medical Rec #:  [id number]     Height:       73.0 in Accession #:    [id number]    Weight:       145.0 lb Date of Birth:  Jun 21, 1976     BSA:          1.877 m Patient Age:    49 years      BP:           131/91 mmHg Patient Gender: M             HR:           100 bpm. Exam Location:  Outpatient Procedure: 2D Echo, Color Doppler and Cardiac Doppler (Both  Spectral and Color            Flow Doppler were utilized during procedure). Indications:    Syncope R55  History:        Patient has Olson prior history of Echocardiogram examinations.  Sonographer:    Tinnie Gosling RDCS Referring Phys: [phone] TIMOTHY S OPYD IMPRESSIONS  1. Left ventricular ejection fraction, by estimation, is 45 to 50%. The left ventricle has mildly decreased function. The left ventricle demonstrates regional wall motion abnormalities (see scoring diagram/findings for description). Left ventricular diastolic parameters were normal.  2. Right ventricular systolic function is normal. The right ventricular size is normal. Tricuspid regurgitation signal is inadequate for assessing PA pressure.  3. The mitral valve is grossly normal. Olson evidence of mitral valve regurgitation. Olson evidence of mitral stenosis.  4. The aortic valve is tricuspid. Aortic valve regurgitation is not visualized. Olson aortic stenosis is present.  5. The inferior vena cava is normal in size with greater than 50% respiratory variability, suggesting right atrial pressure of 3 mmHg. Comparison(s): Olson prior Echocardiogram. FINDINGS  Left Ventricle: Left ventricular ejection fraction, by estimation, is 45 to 50%. The left ventricle has  mildly decreased function. The left ventricle demonstrates regional wall motion abnormalities. The left ventricular internal cavity size was normal in size. There is Olson left ventricular hypertrophy. Left ventricular diastolic parameters were normal.  LV Wall Scoring: The entire septum is hypokinetic. Right Ventricle: The right ventricular size is normal. Right vetricular wall thickness was not assessed. Right ventricular systolic function is normal. Tricuspid regurgitation signal is inadequate for assessing PA pressure. Left Atrium: Left atrial size was normal in size. Right Atrium: Right atrial size was not assessed. Pericardium: There is Olson evidence of pericardial effusion. Mitral Valve: The mitral valve  is grossly normal. Olson evidence of mitral valve regurgitation. Olson evidence of mitral valve stenosis. Tricuspid Valve: The tricuspid valve is normal in structure. Tricuspid valve regurgitation is trivial. Olson evidence of tricuspid stenosis. Aortic Valve: The aortic valve is tricuspid. Aortic valve regurgitation is not visualized. Olson aortic stenosis is present. Pulmonic Valve: The pulmonic valve was normal in structure. Pulmonic valve regurgitation is trivial. Olson evidence of pulmonic stenosis. Aorta: The aortic root and ascending aorta are structurally normal, with Olson evidence of dilitation. Venous: The inferior vena cava is normal in size with greater than 50% respiratory variability, suggesting right atrial pressure of 3 mmHg. IAS/Shunts: The atrial septum is grossly normal.  LEFT VENTRICLE PLAX 2D LVIDd:         4.50 cm     Diastology LVIDs:         3.40 cm     LV e' medial:    7.29 cm/s LV PW:         0.90 cm     LV E/e' medial:  8.8 LV IVS:        0.80 cm     LV e' lateral:   13.50 cm/s LVOT diam:     2.10 cm     LV E/e' lateral: 4.7 LV SV:         54 LV SV Index:   29 LVOT Area:     3.46 cm  LV Volumes (MOD) LV vol d, MOD A2C: 72.2 ml LV vol d, MOD A4C: 87.4 ml LV vol s, MOD A2C: 46.9 ml LV vol s, MOD A4C: 43.7 ml LV SV MOD A2C:     25.3 ml LV SV MOD A4C:     87.4 ml LV SV MOD BP:      36.4 ml RIGHT VENTRICLE             IVC RV S prime:     14.50 cm/s  IVC diam: 1.30 cm TAPSE (M-mode): 1.5 cm                             PULMONARY VEINS                             Diastolic Velocity: 45.70 cm/s                             S/D Velocity:       1.60                             Systolic Velocity:  70.90 cm/s LEFT ATRIUM           Index       RIGHT ATRIUM  Index LA diam:      2.60 cm 1.39 cm/m  RA Area:     15.80 cm LA Vol (A4C): 14.9 ml 7.94 ml/m  RA Volume:   44.80 ml  23.87 ml/m  AORTIC VALVE LVOT Vmax:   99.80 cm/s LVOT Vmean:  74.200 cm/s LVOT VTI:    0.155 m  AORTA Ao Root diam: 3.60 cm Ao Asc diam:   3.50 cm Ao Desc diam: 2.80 cm MITRAL VALVE MV Area (PHT): 4.86 cm    SHUNTS MV Decel Time: 156 msec    Systemic VTI:  0.16 m MV E velocity: 63.90 cm/s  Systemic Diam: 2.10 cm MV A velocity: 67.60 cm/s MV E/A ratio:  0.95 Sunit Tolia Electronically signed by Madonna Large Signature Date/Time: 01/05/2025/2:27:06 PM    Final    DG Chest Port 1 View Result Date: 01/04/2025 EXAM: 1 VIEW(S) XRAY OF THE CHEST 01/04/2025 10:58:27 PM COMPARISON: 12/28/2017 CLINICAL HISTORY: Questionable sepsis - evaluate for abnormality. FINDINGS: LUNGS AND PLEURA: Bibasilar airspace opacities, left greater than right, concerning for pneumonia. Olson pleural effusion. Olson pneumothorax. HEART AND MEDIASTINUM: Olson acute abnormality of the cardiac and mediastinal silhouettes. BONES AND SOFT TISSUES: Olson acute osseous abnormality. IMPRESSION: 1. Bibasilar airspace opacities, left greater than right, suspicious for pneumonia. Electronically signed by: Franky Crease MD 01/04/2025 11:01 PM EST RP Workstation: [id number]   CT Cervical Spine Wo Contrast Result Date: 01/04/2025 EXAM: CT CERVICAL SPINE WITHOUT CONTRAST 01/04/2025 10:35:03 PM TECHNIQUE: CT of the cervical spine was performed without the administration of intravenous contrast. Multiplanar reformatted images are provided for review. Automated exposure control, iterative reconstruction, and/or weight based adjustment of the mA/kV was utilized to reduce the radiation dose to as low as reasonably achievable. COMPARISON: None available. CLINICAL HISTORY: Neck trauma, midline tenderness (Age 28-64y). FINDINGS: BONES AND ALIGNMENT: Olson acute fracture or traumatic malalignment. DEGENERATIVE CHANGES: Olson significant degenerative changes. SOFT TISSUES: Olson prevertebral soft tissue swelling. LUNGS: Mild paraseptal emphysematous changes at the lung apices. IMPRESSION: 1. Olson acute findings. 2. Emphysema (ICD10-J43.9). Electronically signed by: Pinkie Pebbles MD 01/04/2025 10:39 PM EST RP Workstation:  [id number]   CT Head Wo Contrast Result Date: 01/04/2025 EXAM: CT HEAD WITHOUT CONTRAST 01/04/2025 10:35:03 PM TECHNIQUE: CT of the head was performed without the administration of intravenous contrast. Automated exposure control, iterative reconstruction, and/or weight based adjustment of the mA/kV was utilized to reduce the radiation dose to as low as reasonably achievable. COMPARISON: None available. CLINICAL HISTORY: Head trauma, repeat vomiting (Age 55-64y); seizure vs syncope, fall, head pain. FINDINGS: BRAIN AND VENTRICLES: Olson acute hemorrhage. Olson evidence of acute infarct. Olson hydrocephalus. Olson extra-axial collection. Olson mass effect or midline shift. ORBITS: Olson acute abnormality. SINUSES: Olson acute abnormality. SOFT TISSUES AND SKULL: Olson acute soft tissue abnormality. Olson skull fracture. IMPRESSION: 1. Olson acute intracranial abnormality. Electronically signed by: Pinkie Pebbles MD 01/04/2025 10:38 PM EST RP Workstation: [id number]     Assessment and Plan:  Syncope  - sudden drop in the store, Olson prodrome, first episode in life - alcoholic ketoacidosis and AKI noted on initial admission labs  - will monitor for arrhythmia on telemetry while correct hypokalemia and hypomagnesium  - Echo as below, GDMT planned for cardiomyopathy as below  - Olson driving for 6 month - consider neuro imaging for further work up of seizure  Cardiomyopathy  - Echo with LVEF 45-50%, normal diastolic parameters, normal RV.  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, recommended ETOH cessation  -  given family hx of premature CAD and poor lifestyle, reasonable to obtain stress cardiac MRI tomorrow to explore etiology per Damon Floretta recommendation  - Olson loop diuretic indicated at this time  - GDMT: AKI has resolved with Cr 1, BP and tachycardia much improved, will likely start ARNI, beta blocker, SGLT2i, and MRA   QT prolongation - keep K >4 and Mag >2 - repeat EKG  tomorrow - avoid QT prolonging meds   ETOH abuse AKI Hypokalemia  Anemia  Suspected Pneumonia Marijuana abuse  Tobacco abuse   - per primary team     Risk Assessment/Risk Scores:        For questions or updates, please contact [facility] Please consult [email or web address] for contact info under      Signed, Dallon Dacosta, NP  01/05/2025 4:08 PM     [1] Olson Known Allergies  "

## 2025-01-05 NOTE — Procedures (Signed)
 Patient Name: Damon Olson  MRN: [id number]  Epilepsy Attending: Arlin MALVA Krebs  Referring Physician/Provider: Madelyne Owen LABOR, MD  Date: 01/05/2025 Duration: 26.03 mins  Patient history: 49yo M with transient LOC. EEG to evaluate for seizure  Level of alertness: Awake, asleep  AEDs during EEG study: Ativan   Technical aspects: This EEG study was done with scalp electrodes positioned according to the 10-20 International system of electrode placement. Electrical activity was reviewed with band pass filter of 1-70Hz , sensitivity of 7 uV/mm, display speed of 4mm/sec with a 60Hz  notched filter applied as appropriate. EEG data were recorded continuously and digitally stored.  Video monitoring was available and reviewed as appropriate.  Description: The posterior dominant rhythm consists of 9 Hz activity of moderate voltage (25-35 uV) seen predominantly in posterior head regions, symmetric and reactive to eye opening and eye closing. Sleep was characterized by vertex waves, sleep spindles (12 to 14 Hz), maximal frontocentral region. Hyperventilation and photic stimulation were not performed.     IMPRESSION: This study is within normal limits. No seizures or epileptiform discharges were seen throughout the recording.  A normal interictal EEG does not exclude the diagnosis of epilepsy.   Loranzo Desha O Rachana Malesky

## 2025-01-05 NOTE — H&P (Signed)
 " History and Physical    ROSS BENDER FMW:[id number] DOB: 02/15/1976 DOA: 01/04/2025  PCP: Pcp, No   Patient coming from: Home   Chief Complaint: LOC   HPI: Damon Olson is a 49 y.o. male with medical history significant for depression, anxiety, and alcohol abuse, now presenting after loss of consciousness.  Patient reports that he was experiencing lower abdominal discomfort, nausea, and vomiting beginning 12/31/2024.  The symptoms resolved on 01/02/2025 and he was feeling like his normal self today when he was walking around a convenience store.  He apparently suffered a loss of consciousness and EMS was called.  Patient does not remember chest pain, palpitations, lightheadedness, or falling, but states that he remembers waking up with EMS.  He denies biting his tongue.  EMS states that patient had urinary incontinence associated with this.  Patient complains of cough which is just developed this evening.  He normally drinks a sixpack of beer every night after work but has not had any alcohol in 3 days now.  He denies any history of seizure.  ED Course: Upon arrival to the ED, patient is found to be afebrile and saturating well on room air with mild tachycardia and elevated blood pressure.  Labs are most notable for potassium 2.6, magnesium  1.6, BUN 45, creatinine 1.45, AST 74, normal bilirubin, WBC 12,400, and lactic acid 2.5.  There is bibasilar airspace opacities concerning for pneumonia on chest x-ray.  No acute findings noted on head CT or cervical spine CT.  Blood cultures were collected in the ED and the patient was treated with Rocephin , doxycycline , IV magnesium , IV potassium, and 1 L NS.  Review of Systems:  All other systems reviewed and apart from HPI, are negative.  Past Medical History:  Diagnosis Date   Anxiety    Depression    Marijuana use 05/06/2021   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HCC) 05/06/2021   Panic disorder 05/06/2021   Unspecified mood (affective) disorder 05/06/2021     History reviewed. No pertinent surgical history.  Social History:   reports that he has been smoking cigarettes. He has a 15 pack-year smoking history. He has never used smokeless tobacco. He reports current alcohol use. He reports that he does not use drugs.  Allergies[1]  History reviewed. No pertinent family history.   Prior to Admission medications  Medication Sig Start Date End Date Taking? Authorizing Provider  ascorbic acid (VITAMIN C) 100 MG tablet Take 100 mg by mouth daily.    [provider]  citalopram  (CELEXA ) 20 MG tablet Take 1 tablet (20 mg total) by mouth daily. 12/31/21   Parsons, Brittney E, NP  Ergocalciferol (VITAMIN D2 PO) Take 1 tablet by mouth daily.    [provider]  hydrOXYzine  (ATARAX ) 10 MG tablet Take 1 tablet (10 mg total) by mouth 3 (three) times daily as needed. 12/31/21   Parsons, Brittney E, NP  Multiple Vitamin (MULTIVITAMIN WITH MINERALS) TABS tablet Take 1 tablet by mouth daily.    [provider]  Omega-3 1000 MG CAPS Take 1,000 mg by mouth daily.    [provider]  traZODone  (DESYREL ) 50 MG tablet Take 1 tablet (50 mg total) by mouth at bedtime as needed for sleep. 12/31/21   Harl Zane BRAVO, NP    Physical Exam: Vitals:   01/04/25 2205 01/04/25 2245 01/04/25 2300 01/05/25 0000  BP: (!) 137/91 (!) 137/113 (!) 142/124 (!) 139/93  Pulse: (!) 117 (!) 112 (!) 109 94  Resp: (!) 21  19 19 17   Temp: (!) 97.3 F (36.3 C)     TempSrc: Temporal     SpO2: 100% 100% 100% 100%  Weight: 65.8 kg     Height: 6' 1 (1.854 m)       Constitutional: NAD, no pallor or diaphoresis   Eyes: PERTLA, lids and conjunctivae normal ENMT: Mucous membranes are moist. Posterior pharynx clear of any exudate or lesions.   Neck: supple, no masses  Respiratory: no wheezing. Frequent cough. No accessory muscle use.  Cardiovascular: Rate ~120 and regular. No extremity edema.  Abdomen: No tenderness, soft. Bowel sounds active.   Musculoskeletal: no clubbing / cyanosis. No joint deformity upper and lower extremities.   Skin: no significant rashes, lesions, ulcers. Warm, dry, well-perfused. Neurologic: CN 2-12 grossly intact. Strength 5/5 in all 4 limbs. Alert and oriented.  Psychiatric: Calm. Cooperative.    Labs and Imaging on Admission: I have personally reviewed following labs and imaging studies  CBC: Recent Labs  Lab 01/04/25 2207 01/04/25 2223  WBC 12.4*  --   NEUTROABS 10.4*  --   HGB 11.3* 11.9*  HCT 30.3* 35.0*  MCV 95.6  --   PLT 389  --    Basic Metabolic Panel: Recent Labs  Lab 01/04/25 2207 01/04/25 2223  NA 132* 132*  K 2.6* 2.5*  CL 91* 95*  CO2 23  --   GLUCOSE 181* 188*  BUN 45* 50*  CREATININE 1.45* 1.40*  CALCIUM 9.7  --   MG 1.6*  --    GFR: Estimated Creatinine Clearance: 60.7 mL/min (A) (by C-G formula based on SCr of 1.4 mg/dL (H)). Liver Function Tests: Recent Labs  Lab 01/04/25 2207  AST 74*  ALT 36  ALKPHOS 130*  BILITOT 0.7  PROT 7.3  ALBUMIN 3.9   No results for input(s): LIPASE, AMYLASE in the last 168 hours. No results for input(s): AMMONIA in the last 168 hours. Coagulation Profile: Recent Labs  Lab 01/04/25 2252  INR 1.0   Cardiac Enzymes: No results for input(s): CKTOTAL, CKMB, CKMBINDEX, TROPONINI in the last 168 hours. BNP (last 3 results) No results for input(s): PROBNP in the last 8760 hours. HbA1C: No results for input(s): HGBA1C in the last 72 hours. CBG: Recent Labs  Lab 01/04/25 2240  GLUCAP 179*   Lipid Profile: No results for input(s): CHOL, HDL, LDLCALC, TRIG, CHOLHDL, LDLDIRECT in the last 72 hours. Thyroid Function Tests: No results for input(s): TSH, T4TOTAL, FREET4, T3FREE, THYROIDAB in the last 72 hours. Anemia Panel: No results for input(s): VITAMINB12, FOLATE, FERRITIN, TIBC, IRON, RETICCTPCT in the last 72 hours. Urine analysis:    Component Value Date/Time    COLORURINE YELLOW 01/04/2025 2306   APPEARANCEUR CLOUDY (A) 01/04/2025 2306   LABSPEC 1.018 01/04/2025 2306   PHURINE 5.0 01/04/2025 2306   GLUCOSEU NEGATIVE 01/04/2025 2306   HGBUR NEGATIVE 01/04/2025 2306   BILIRUBINUR NEGATIVE 01/04/2025 2306   KETONESUR 5 (A) 01/04/2025 2306   PROTEINUR 100 (A) 01/04/2025 2306   NITRITE NEGATIVE 01/04/2025 2306   LEUKOCYTESUR NEGATIVE 01/04/2025 2306   Sepsis Labs: @LABRCNTIP (procalcitonin:4,lacticidven:4) ) Recent Results (from the past 240 hours)  Resp panel by RT-PCR (RSV, Flu A&B, Covid) Urine, Clean Catch     Status: None   Collection Time: 01/04/25 10:17 PM   Specimen: Urine, Clean Catch; Nasal Swab  Result Value Ref Range Status   SARS Coronavirus 2 by RT PCR NEGATIVE NEGATIVE Final   Influenza A by PCR NEGATIVE NEGATIVE Final   Influenza B  by PCR NEGATIVE NEGATIVE Final    Comment: (NOTE) The Xpert Xpress SARS-CoV-2/FLU/RSV plus assay is intended as an aid in the diagnosis of influenza from Nasopharyngeal swab specimens and should not be used as a sole basis for treatment. Nasal washings and aspirates are unacceptable for Xpert Xpress SARS-CoV-2/FLU/RSV testing.  Fact Sheet for Patients: bloggercourse.com  Fact Sheet for Healthcare Providers: seriousbroker.it  This test is not yet approved or cleared by the [place]  FDA and has been authorized for detection and/or diagnosis of SARS-CoV-2 by FDA under an Emergency Use Authorization (EUA). This EUA will remain in effect (meaning this test can be used) for the duration of the COVID-19 declaration under Section 564(b)(1) of the Act, 21 U.S.C. section 360bbb-3(b)(1), unless the authorization is terminated or revoked.     Resp Syncytial Virus by PCR NEGATIVE NEGATIVE Final    Comment: (NOTE) Fact Sheet for Patients: bloggercourse.com  Fact Sheet for Healthcare  Providers: seriousbroker.it  This test is not yet approved or cleared by the [place]  FDA and has been authorized for detection and/or diagnosis of SARS-CoV-2 by FDA under an Emergency Use Authorization (EUA). This EUA will remain in effect (meaning this test can be used) for the duration of the COVID-19 declaration under Section 564(b)(1) of the Act, 21 U.S.C. section 360bbb-3(b)(1), unless the authorization is terminated or revoked.  Performed at [facility], [address], [place], [place] [address]      Radiological Exams on Admission: DG Chest Port 1 View Result Date: 01/04/2025 EXAM: 1 VIEW(S) XRAY OF THE CHEST 01/04/2025 10:58:27 PM COMPARISON: 12/28/2017 CLINICAL HISTORY: Questionable sepsis - evaluate for abnormality. FINDINGS: LUNGS AND PLEURA: Bibasilar airspace opacities, left greater than right, concerning for pneumonia. No pleural effusion. No pneumothorax. HEART AND MEDIASTINUM: No acute abnormality of the cardiac and mediastinal silhouettes. BONES AND SOFT TISSUES: No acute osseous abnormality. IMPRESSION: 1. Bibasilar airspace opacities, left greater than right, suspicious for pneumonia. Electronically signed by: Franky Crease MD 01/04/2025 11:01 PM EST RP Workstation: [id number]   CT Cervical Spine Wo Contrast Result Date: 01/04/2025 EXAM: CT CERVICAL SPINE WITHOUT CONTRAST 01/04/2025 10:35:03 PM TECHNIQUE: CT of the cervical spine was performed without the administration of intravenous contrast. Multiplanar reformatted images are provided for review. Automated exposure control, iterative reconstruction, and/or weight based adjustment of the mA/kV was utilized to reduce the radiation dose to as low as reasonably achievable. COMPARISON: None available. CLINICAL HISTORY: Neck trauma, midline tenderness (Age 35-64y). FINDINGS: BONES AND ALIGNMENT: No acute fracture or traumatic malalignment. DEGENERATIVE CHANGES: No significant degenerative  changes. SOFT TISSUES: No prevertebral soft tissue swelling. LUNGS: Mild paraseptal emphysematous changes at the lung apices. IMPRESSION: 1. No acute findings. 2. Emphysema (ICD10-J43.9). Electronically signed by: Pinkie Pebbles MD 01/04/2025 10:39 PM EST RP Workstation: [id number]   CT Head Wo Contrast Result Date: 01/04/2025 EXAM: CT HEAD WITHOUT CONTRAST 01/04/2025 10:35:03 PM TECHNIQUE: CT of the head was performed without the administration of intravenous contrast. Automated exposure control, iterative reconstruction, and/or weight based adjustment of the mA/kV was utilized to reduce the radiation dose to as low as reasonably achievable. COMPARISON: None available. CLINICAL HISTORY: Head trauma, repeat vomiting (Age 86-64y); seizure vs syncope, fall, head pain. FINDINGS: BRAIN AND VENTRICLES: No acute hemorrhage. No evidence of acute infarct. No hydrocephalus. No extra-axial collection. No mass effect or midline shift. ORBITS: No acute abnormality. SINUSES: No acute abnormality. SOFT TISSUES AND SKULL: No acute soft tissue abnormality. No skull fracture. IMPRESSION: 1. No acute intracranial abnormality. Electronically signed by:  Pinkie Pebbles MD 01/04/2025 10:38 PM EST RP Workstation: [id number]    EKG: Independently reviewed. Sinus tachycardia, rate 116, LVH, QTc 538 ms.   Assessment/Plan   1. Transient LOC  - Presents after brief LOC, had urinary incontinence per EMS  - Syncope vs first seizure, alcohol withdrawal and electrolyte derangements possibly contributing  - Check orthostatic vitals, continue cardiac monitoring, check echocardiogram, MRI brain, and EEG   2. Pneumonia - Bibasilar infiltrates noted on CXR; he has productive cough and multiple SIRS criteria; may be due to aspiration  - Continue empiric antibiotics and supportive care   3. Alcohol abuse  - Drinks a six pack of beer almost every night but none in 3 days    - Monitor with CIWA, use Ativan  as needed, supplement  vitamins   4. Hypokalemia; hypomagnesemia   - Replacing potassium and magnesium , will repeat chemistry panel in am    5. Prolonged QT interval  - Continue cardiac monitoring, correct electrolytes, avoid QT-prolonging medications     DVT prophylaxis: Lovenox   Code Status: Full  Level of Care: Level of care: Progressive Family Communication: None present   Disposition Plan:  Patient is from: Home  Anticipated d/c is to: Home  Anticipated d/c date is: 1/23  or 01/07/25  Patient currently: Pending additional workup, electrolyte repletion  Consults called: None  Admission status: Inpatient     Evalene GORMAN Sprinkles, MD [facility]  01/05/2025, 1:24 AM       [1] No Known Allergies  "

## 2025-01-06 ENCOUNTER — Inpatient Hospital Stay (HOSPITAL_COMMUNITY): Payer: Self-pay

## 2025-01-06 DIAGNOSIS — R55 Syncope and collapse: Secondary | ICD-10-CM

## 2025-01-06 LAB — C DIFFICILE QUICK SCREEN W PCR REFLEX
C Diff antigen: NEGATIVE
C Diff interpretation: NOT DETECTED
C Diff toxin: NEGATIVE

## 2025-01-06 LAB — MAGNESIUM: Magnesium: 1.6 mg/dL — ABNORMAL LOW (ref 1.7–2.4)

## 2025-01-06 LAB — BASIC METABOLIC PANEL WITH GFR
Anion gap: 12 (ref 5–15)
Anion gap: 10 (ref 5–15)
BUN: 17 mg/dL (ref 6–20)
BUN: 13 mg/dL (ref 6–20)
CO2: 23 mmol/L (ref 22–32)
CO2: 25 mmol/L (ref 22–32)
Calcium: 8.9 mg/dL (ref 8.9–10.3)
Calcium: 8.9 mg/dL (ref 8.9–10.3)
Chloride: 102 mmol/L (ref 98–111)
Chloride: 102 mmol/L (ref 98–111)
Creatinine, Ser: 0.81 mg/dL (ref 0.61–1.24)
Creatinine, Ser: 0.76 mg/dL (ref 0.61–1.24)
GFR, Estimated: >60 mL/min
GFR, Estimated: >60 mL/min
Glucose, Bld: 121 mg/dL — ABNORMAL HIGH (ref 70–99)
Glucose, Bld: 160 mg/dL — ABNORMAL HIGH (ref 70–99)
Potassium: 2.5 mmol/L — CL (ref 3.5–5.1)
Potassium: 3.2 mmol/L — ABNORMAL LOW (ref 3.5–5.1)
Sodium: 136 mmol/L (ref 135–145)
Sodium: 137 mmol/L (ref 135–145)

## 2025-01-06 LAB — CBC
HCT: 25.8 % — ABNORMAL LOW (ref 39.0–52.0)
Hemoglobin: 9.3 g/dL — ABNORMAL LOW (ref 13.0–17.0)
MCH: 35.4 pg — ABNORMAL HIGH (ref 26.0–34.0)
MCHC: 36 g/dL (ref 30.0–36.0)
MCV: 98.1 fL (ref 80.0–100.0)
Platelets: 358 K/uL (ref 150–400)
RBC: 2.63 MIL/uL — ABNORMAL LOW (ref 4.22–5.81)
RDW: 17.1 % — ABNORMAL HIGH (ref 11.5–15.5)
WBC: 8.3 K/uL (ref 4.0–10.5)
nRBC: 0 % (ref 0.0–0.2)

## 2025-01-06 LAB — GLUCOSE, CAPILLARY: Glucose-Capillary: 143 mg/dL — ABNORMAL HIGH (ref 70–99)

## 2025-01-06 LAB — SYPHILIS: RPR W/REFLEX TO RPR TITER AND TREPONEMAL ANTIBODIES, TRADITIONAL SCREENING AND DIAGNOSIS ALGORITHM: RPR Ser Ql: NONREACTIVE

## 2025-01-06 LAB — PHOSPHORUS: Phosphorus: 1.1 mg/dL — ABNORMAL LOW (ref 2.5–4.6)

## 2025-01-06 MED ORDER — MORPHINE SULFATE (PF) 2 MG/ML IV SOLN: 1.0000 mg | INTRAVENOUS | Status: DC | PRN

## 2025-01-06 MED ORDER — GADOBUTROL 1 MMOL/ML IV SOLN: 9.0000 mL | Freq: Once | INTRAVENOUS | Status: DC | PRN

## 2025-01-06 MED ORDER — POTASSIUM CHLORIDE CRYS ER 20 MEQ PO TBCR
40.0000 meq | EXTENDED_RELEASE_TABLET | ORAL | Status: AC
  Administered 2025-01-06 (×2): 40 meq via ORAL
  Filled 2025-01-06 (×2): qty 2

## 2025-01-06 MED ORDER — POTASSIUM CHLORIDE CRYS ER 20 MEQ PO TBCR
40.0000 meq | EXTENDED_RELEASE_TABLET | Freq: Once | ORAL | Status: AC
  Administered 2025-01-06: 40 meq via ORAL
  Filled 2025-01-06: qty 2

## 2025-01-06 MED ORDER — PANTOPRAZOLE SODIUM 40 MG PO TBEC
40.0000 mg | DELAYED_RELEASE_TABLET | Freq: Two times a day (BID) | ORAL | Status: DC
  Administered 2025-01-06 – 2025-01-12 (×13): 40 mg via ORAL
  Filled 2025-01-06 (×13): qty 1

## 2025-01-06 MED ORDER — REGADENOSON 0.4 MG/5ML IV SOLN
0.4000 mg | Freq: Once | INTRAVENOUS | Status: AC
  Administered 2025-01-06: 0.4 mg via INTRAVENOUS

## 2025-01-06 MED ORDER — MAGNESIUM SULFATE 2 GM/50ML IV SOLN
2.0000 g | Freq: Once | INTRAVENOUS | Status: AC
  Administered 2025-01-06: 2 g via INTRAVENOUS
  Filled 2025-01-06: qty 50

## 2025-01-06 MED ORDER — POTASSIUM CHLORIDE 10 MEQ/100ML IV SOLN
10.0000 meq | INTRAVENOUS | Status: AC
  Administered 2025-01-06 (×4): 10 meq via INTRAVENOUS
  Filled 2025-01-06 (×4): qty 100

## 2025-01-06 MED ORDER — AMINOPHYLLINE 25 MG/ML IV SOLN
INTRAVENOUS | Status: AC
  Filled 2025-01-06: qty 10

## 2025-01-06 MED ORDER — POTASSIUM & SODIUM PHOSPHATES 280-160-250 MG PO PACK
2.0000 | PACK | ORAL | Status: AC
  Administered 2025-01-06 (×3): 2 via ORAL
  Filled 2025-01-06 (×4): qty 2

## 2025-01-06 MED ORDER — REGADENOSON 0.4 MG/5ML IV SOLN
INTRAVENOUS | Status: AC
  Filled 2025-01-06: qty 5

## 2025-01-06 MED ORDER — SACCHAROMYCES BOULARDII 250 MG PO CAPS
250.0000 mg | ORAL_CAPSULE | Freq: Two times a day (BID) | ORAL | Status: DC
  Administered 2025-01-06 – 2025-01-12 (×12): 250 mg via ORAL
  Filled 2025-01-06 (×12): qty 1

## 2025-01-06 NOTE — TOC CAGE-AID Note (Signed)
 Transition of Care [facility]) - CAGE-AID Screening   Patient Details  Name: Damon Olson MRN: [id number] Date of Birth: 01/06/1976  Transition of Care [facility] CM/SW Contact:    Andrez JULIANNA George, RN Phone Number: 01/06/2025, 10:30 AM   Clinical Narrative:  CM provided pt with inpatient/ outpatient counseling resources.   CAGE-AID Screening:    Have You Ever Felt You Ought to Cut Down on Your Drinking or Drug Use?: Yes Have People Annoyed You By Critizing Your Drinking Or Drug Use?: No Have You Felt Bad Or Guilty About Your Drinking Or Drug Use?: No Have You Ever Had a Drink or Used Drugs First Thing In The Morning to Steady Your Nerves or to Get Rid of a Hangover?: Yes CAGE-AID Score: 2  Substance Abuse Education Offered: Yes (provided)

## 2025-01-06 NOTE — Progress Notes (Signed)
 " PROGRESS NOTE    Damon Olson  FMW:[id number] DOB: Jun 18, 1976 DOA: 01/04/2025 PCP: Pcp, No   Brief Narrative: 49 year old with past medical history significant for depression, anxiety, alcohol abuse presented after loss of consciousness.  Patient reports a history of nausea vomiting lower abdominal pain on 1/17 symptoms resolved by 1/19.  He normally drinks a sixpack of beer every night after work, but has not had any alcohol in 3 days.  Denies any history of seizures.  Per EMS report patient was incontinent in urine.  Evaluation in the ED lactic acid 2.5, white blood cell 12, creatinine 1.5, magnesium  1.6 potassium 2.6.  CT head and cervical spine no acute findings.  Chest x-ray bibasilar airspace opacity concerning for pneumonia.  Assessment & Plan:   Principal Problem:   Syncope and collapse Active Problems:   Unspecified mood (affective) disorder   Hypokalemia   Hypomagnesemia   Pneumonia   Prolonged QT interval  1-Syncope versus Seizure - Presented after brief loss of consciousness, had urinary incontinence per EMS. -suspect Syncope in setting hypotension and severe hypokalemia.  -FU ECHO: abnormal ECHO EF 45--50 %, regional wall motion abnormalities. Will consult cardiology  -Treated  with IV fluids. Replete electrolytes.  MRI: negative for Stroke.  EEG negative for seizure.    Pneumonia: - He reported productive cough, SIRS criteria, chest x-ray bibasilar infiltrates, leukocytosis white blood cell 12. -COVID RSV influenza negative. -Blood cultures no growth to date. - Continue IV antibiotics  Alcohol abuse: - Monitor on CIWA  Cardiomyopathy;  EF 45 % Suspect ETOH related.  Cardiac MRI ordered.  Appreciate cardiology evaluation.   Hypokalemia, hypomagnesemia, Hypophosphatemia.  - Received IV and oral supplement.  -replete mg and phosphorus.  -Continue to replete K   Prolonged QT: -Correct electrolyte  Weakness; generalized. FTT,   -B12 normal , TSH:  1.1, Thiamine  pending.  -nutrition consultation   Diarrhea; check for C diff.  GI pathogen.    Estimated body mass index is 18.42 kg/m as calculated from the following:   Height as of this encounter: 6' (1.829 m).   Weight as of this encounter: 61.6 kg.   DVT prophylaxis: Lovenox  Code Status: Full code Family Communication: care dicussed with patient.  Disposition Plan:  Status is: Inpatient Remains inpatient appropriate because: management of syncope.     Consultants:  Cardiology  Procedures:  ECHO  Antimicrobials:    Subjective: Feels better, had diarrhea.  Report some abdominal pain.   Objective: Vitals:   01/06/25 1123 01/06/25 1124 01/06/25 1136 01/06/25 1307  BP: 111/74 105/74 126/84 (!) 141/93  Pulse: (!) 102  97 94  Resp:    16  Temp:    98.6 F (37 C)  TempSrc:    Oral  SpO2:    98%  Weight:      Height:        Intake/Output Summary (Last 24 hours) at 01/06/2025 1423 Last data filed at 01/06/2025 0600 Gross per 24 hour  Intake 2453.65 ml  Output --  Net 2453.65 ml   Filed Weights   01/04/25 2205 01/05/25 1800 01/06/25 0500  Weight: 65.8 kg 62.8 kg 61.6 kg    Examination:  General exam: No acute distress.  Respiratory system: CTA CVS; S 1, S 2 RRR Gastrointestinal system: BS present, soft, nt Central nervous system: alert Extremities: no edema  Data Reviewed: I have personally reviewed following labs and imaging studies  CBC: Recent Labs  Lab 01/04/25 2207 01/04/25 2223 01/05/25 0525 01/06/25 0715  WBC 12.4*  --  10.7* 8.3  NEUTROABS 10.4*  --   --   --   HGB 11.3* 11.9* 10.2* 9.3*  HCT 30.3* 35.0* 27.8* 25.8*  MCV 95.6  --  97.9 98.1  PLT 389  --  357 358   Basic Metabolic Panel: Recent Labs  Lab 01/04/25 2207 01/04/25 2223 01/05/25 0525 01/06/25 0715  NA 132* 132* 134* 136  K 2.6* 2.5* 3.0* 2.5*  CL 91* 95* 96* 102  CO2 23  --  21* 23  GLUCOSE 181* 188* 138* 121*  BUN 45* 50* 35* 17  CREATININE 1.45* 1.40* 1.00  0.81  CALCIUM 9.7  --  9.1 8.9  MG 1.6*  --  2.2 1.6*  PHOS  --   --  2.5 1.1*   GFR: Estimated Creatinine Clearance: 98.2 mL/min (by C-G formula based on SCr of 0.81 mg/dL). Liver Function Tests: Recent Labs  Lab 01/04/25 2207 01/05/25 0525  AST 74* 59*  ALT 36 30  ALKPHOS 130* 134*  BILITOT 0.7 0.5  PROT 7.3 6.5  ALBUMIN 3.9 3.5   No results for input(s): LIPASE, AMYLASE in the last 168 hours. No results for input(s): AMMONIA in the last 168 hours. Coagulation Profile: Recent Labs  Lab 01/04/25 2252  INR 1.0   Cardiac Enzymes: No results for input(s): CKTOTAL, CKMB, CKMBINDEX, TROPONINI in the last 168 hours. BNP (last 3 results) No results for input(s): PROBNP in the last 8760 hours. HbA1C: No results for input(s): HGBA1C in the last 72 hours. CBG: Recent Labs  Lab 01/04/25 2240 01/05/25 0536 01/06/25 0759  GLUCAP 179* 134* 143*   Lipid Profile: No results for input(s): CHOL, HDL, LDLCALC, TRIG, CHOLHDL, LDLDIRECT in the last 72 hours. Thyroid Function Tests: Recent Labs    01/05/25 1930  TSH 1.130   Anemia Panel: Recent Labs    01/05/25 1930  VITAMINB12 692  FOLATE 16.8   Sepsis Labs: Recent Labs  Lab 01/04/25 2220 01/05/25 0026  LATICACIDVEN 2.5* 0.7    Recent Results (from the past 240 hours)  Resp panel by RT-PCR (RSV, Flu A&B, Covid) Urine, Clean Catch     Status: None   Collection Time: 01/04/25 10:17 PM   Specimen: Urine, Clean Catch; Nasal Swab  Result Value Ref Range Status   SARS Coronavirus 2 by RT PCR NEGATIVE NEGATIVE Final   Influenza A by PCR NEGATIVE NEGATIVE Final   Influenza B by PCR NEGATIVE NEGATIVE Final    Comment: (NOTE) The Xpert Xpress SARS-CoV-2/FLU/RSV plus assay is intended as an aid in the diagnosis of influenza from Nasopharyngeal swab specimens and should not be used as a sole basis for treatment. Nasal washings and aspirates are unacceptable for Xpert Xpress  SARS-CoV-2/FLU/RSV testing.  Fact Sheet for Patients: bloggercourse.com  Fact Sheet for Healthcare Providers: seriousbroker.it  This test is not yet approved or cleared by the [place]  FDA and has been authorized for detection and/or diagnosis of SARS-CoV-2 by FDA under an Emergency Use Authorization (EUA). This EUA will remain in effect (meaning this test can be used) for the duration of the COVID-19 declaration under Section 564(b)(1) of the Act, 21 U.S.C. section 360bbb-3(b)(1), unless the authorization is terminated or revoked.     Resp Syncytial Virus by PCR NEGATIVE NEGATIVE Final    Comment: (NOTE) Fact Sheet for Patients: bloggercourse.com  Fact Sheet for Healthcare Providers: seriousbroker.it  This test is not yet approved or cleared by the [place]  FDA and has been authorized for detection  and/or diagnosis of SARS-CoV-2 by FDA under an Emergency Use Authorization (EUA). This EUA will remain in effect (meaning this test can be used) for the duration of the COVID-19 declaration under Section 564(b)(1) of the Act, 21 U.S.C. section 360bbb-3(b)(1), unless the authorization is terminated or revoked.  Performed at [facility], [address] [place], [place] [address]   Culture, blood (routine x 2)     Status: None (Preliminary result)   Collection Time: 01/04/25 10:52 PM   Specimen: BLOOD LEFT FOREARM  Result Value Ref Range Status   Specimen Description BLOOD LEFT FOREARM  Final   Special Requests   Final    BOTTLES DRAWN AEROBIC AND ANAEROBIC Blood Culture results may not be optimal due to an inadequate volume of blood received in culture bottles   Culture   Final    NO GROWTH 2 DAYS Performed at [facility], [address], [place], [place] [address]    Report Status PENDING  Incomplete  Culture, blood (routine x 2)     Status: None  (Preliminary result)   Collection Time: 01/04/25 10:55 PM   Specimen: BLOOD  Result Value Ref Range Status   Specimen Description BLOOD SITE NOT SPECIFIED  Final   Special Requests   Final    BOTTLES DRAWN AEROBIC AND ANAEROBIC Blood Culture adequate volume   Culture   Final    NO GROWTH 2 DAYS Performed at [facility], [address] [place], [place] [address]    Report Status PENDING  Incomplete         Radiology Studies: MR BRAIN WO CONTRAST Result Date: 01/05/2025 EXAM: MRI Brain Without Contrast 01/05/2025 04:42:02 PM TECHNIQUE: Multiplanar multisequence MRI of the head/brain was performed without the administration of intravenous contrast. COMPARISON: CT head 01/04/25. CLINICAL HISTORY: Syncope/presyncope, cerebrovascular cause suspected FINDINGS: BRAIN AND VENTRICLES: No acute infarct. No intracranial hemorrhage. No mass. No midline shift. No hydrocephalus. The sella is unremarkable. Normal flow voids. ORBITS: No significant abnormality. SINUSES AND MASTOIDS: No significant abnormality. BONES AND SOFT TISSUES: Normal marrow signal. No soft tissue abnormality. IMPRESSION: 1. No acute abnormality. Electronically signed by: Glendia Molt MD 01/05/2025 05:07 PM EST RP Workstation: [id number]   EEG adult Result Date: 01/05/2025 Shelton Arlin KIDD, MD     01/05/2025  3:16 PM Patient Name: HARVIR PATRY MRN: [id number] Epilepsy Attending: Arlin KIDD Shelton Referring Physician/Provider: Madelyne Owen LABOR, MD Date: 01/05/2025 Duration: 26.03 mins Patient history: 49yo M with transient LOC. EEG to evaluate for seizure Level of alertness: Awake, asleep AEDs during EEG study: Ativan  Technical aspects: This EEG study was done with scalp electrodes positioned according to the 10-20 International system of electrode placement. Electrical activity was reviewed with band pass filter of 1-70Hz , sensitivity of 7 uV/mm, display speed of 15mm/sec with a 60Hz  notched filter applied as appropriate. EEG data  were recorded continuously and digitally stored.  Video monitoring was available and reviewed as appropriate. Description: The posterior dominant rhythm consists of 9 Hz activity of moderate voltage (25-35 uV) seen predominantly in posterior head regions, symmetric and reactive to eye opening and eye closing. Sleep was characterized by vertex waves, sleep spindles (12 to 14 Hz), maximal frontocentral region. Hyperventilation and photic stimulation were not performed.   IMPRESSION: This study is within normal limits. No seizures or epileptiform discharges were seen throughout the recording. A normal interictal EEG does not exclude the diagnosis of epilepsy. Arlin KIDD Shelton   ECHOCARDIOGRAM COMPLETE Result Date: 01/05/2025  ECHOCARDIOGRAM REPORT   Patient Name:   ASPEN DETERDING Date of Exam: 01/05/2025 Medical Rec #:  [id number]     Height:       73.0 in Accession #:    [id number]    Weight:       145.0 lb Date of Birth:  1976-08-03     BSA:          1.877 m Patient Age:    49 years      BP:           131/91 mmHg Patient Gender: M             HR:           100 bpm. Exam Location:  Outpatient Procedure: 2D Echo, Color Doppler and Cardiac Doppler (Both Spectral and Color            Flow Doppler were utilized during procedure). Indications:    Syncope R55  History:        Patient has no prior history of Echocardiogram examinations.  Sonographer:    Tinnie Gosling RDCS Referring Phys: [id number] TIMOTHY S OPYD IMPRESSIONS  1. Left ventricular ejection fraction, by estimation, is 45 to 50%. The left ventricle has mildly decreased function. The left ventricle demonstrates regional wall motion abnormalities (see scoring diagram/findings for description). Left ventricular diastolic parameters were normal.  2. Right ventricular systolic function is normal. The right ventricular size is normal. Tricuspid regurgitation signal is inadequate for assessing PA pressure.  3. The mitral valve is grossly normal. No evidence of mitral  valve regurgitation. No evidence of mitral stenosis.  4. The aortic valve is tricuspid. Aortic valve regurgitation is not visualized. No aortic stenosis is present.  5. The inferior vena cava is normal in size with greater than 50% respiratory variability, suggesting right atrial pressure of 3 mmHg. Comparison(s): No prior Echocardiogram. FINDINGS  Left Ventricle: Left ventricular ejection fraction, by estimation, is 45 to 50%. The left ventricle has mildly decreased function. The left ventricle demonstrates regional wall motion abnormalities. The left ventricular internal cavity size was normal in size. There is no left ventricular hypertrophy. Left ventricular diastolic parameters were normal.  LV Wall Scoring: The entire septum is hypokinetic. Right Ventricle: The right ventricular size is normal. Right vetricular wall thickness was not assessed. Right ventricular systolic function is normal. Tricuspid regurgitation signal is inadequate for assessing PA pressure. Left Atrium: Left atrial size was normal in size. Right Atrium: Right atrial size was not assessed. Pericardium: There is no evidence of pericardial effusion. Mitral Valve: The mitral valve is grossly normal. No evidence of mitral valve regurgitation. No evidence of mitral valve stenosis. Tricuspid Valve: The tricuspid valve is normal in structure. Tricuspid valve regurgitation is trivial. No evidence of tricuspid stenosis. Aortic Valve: The aortic valve is tricuspid. Aortic valve regurgitation is not visualized. No aortic stenosis is present. Pulmonic Valve: The pulmonic valve was normal in structure. Pulmonic valve regurgitation is trivial. No evidence of pulmonic stenosis. Aorta: The aortic root and ascending aorta are structurally normal, with no evidence of dilitation. Venous: The inferior vena cava is normal in size with greater than 50% respiratory variability, suggesting right atrial pressure of 3 mmHg. IAS/Shunts: The atrial septum is grossly  normal.  LEFT VENTRICLE PLAX 2D LVIDd:         4.50 cm     Diastology LVIDs:         3.40 cm     LV e' medial:  7.29 cm/s LV PW:         0.90 cm     LV E/e' medial:  8.8 LV IVS:        0.80 cm     LV e' lateral:   13.50 cm/s LVOT diam:     2.10 cm     LV E/e' lateral: 4.7 LV SV:         54 LV SV Index:   29 LVOT Area:     3.46 cm  LV Volumes (MOD) LV vol d, MOD A2C: 72.2 ml LV vol d, MOD A4C: 87.4 ml LV vol s, MOD A2C: 46.9 ml LV vol s, MOD A4C: 43.7 ml LV SV MOD A2C:     25.3 ml LV SV MOD A4C:     87.4 ml LV SV MOD BP:      36.4 ml RIGHT VENTRICLE             IVC RV S prime:     14.50 cm/s  IVC diam: 1.30 cm TAPSE (M-mode): 1.5 cm                             PULMONARY VEINS                             Diastolic Velocity: 45.70 cm/s                             S/D Velocity:       1.60                             Systolic Velocity:  70.90 cm/s LEFT ATRIUM           Index       RIGHT ATRIUM           Index LA diam:      2.60 cm 1.39 cm/m  RA Area:     15.80 cm LA Vol (A4C): 14.9 ml 7.94 ml/m  RA Volume:   44.80 ml  23.87 ml/m  AORTIC VALVE LVOT Vmax:   99.80 cm/s LVOT Vmean:  74.200 cm/s LVOT VTI:    0.155 m  AORTA Ao Root diam: 3.60 cm Ao Asc diam:  3.50 cm Ao Desc diam: 2.80 cm MITRAL VALVE MV Area (PHT): 4.86 cm    SHUNTS MV Decel Time: 156 msec    Systemic VTI:  0.16 m MV E velocity: 63.90 cm/s  Systemic Diam: 2.10 cm MV A velocity: 67.60 cm/s MV E/A ratio:  0.95 Sunit Tolia Electronically signed by Madonna Large Signature Date/Time: 01/05/2025/2:27:06 PM    Final    DG Chest Port 1 View Result Date: 01/04/2025 EXAM: 1 VIEW(S) XRAY OF THE CHEST 01/04/2025 10:58:27 PM COMPARISON: 12/28/2017 CLINICAL HISTORY: Questionable sepsis - evaluate for abnormality. FINDINGS: LUNGS AND PLEURA: Bibasilar airspace opacities, left greater than right, concerning for pneumonia. No pleural effusion. No pneumothorax. HEART AND MEDIASTINUM: No acute abnormality of the cardiac and mediastinal silhouettes. BONES AND SOFT  TISSUES: No acute osseous abnormality. IMPRESSION: 1. Bibasilar airspace opacities, left greater than right, suspicious for pneumonia. Electronically signed by: Franky Crease MD 01/04/2025 11:01 PM EST RP Workstation: [id number]   CT Cervical Spine Wo Contrast Result Date: 01/04/2025 EXAM: CT CERVICAL SPINE WITHOUT CONTRAST 01/04/2025 10:35:03 PM TECHNIQUE: CT of the  cervical spine was performed without the administration of intravenous contrast. Multiplanar reformatted images are provided for review. Automated exposure control, iterative reconstruction, and/or weight based adjustment of the mA/kV was utilized to reduce the radiation dose to as low as reasonably achievable. COMPARISON: None available. CLINICAL HISTORY: Neck trauma, midline tenderness (Age 30-64y). FINDINGS: BONES AND ALIGNMENT: No acute fracture or traumatic malalignment. DEGENERATIVE CHANGES: No significant degenerative changes. SOFT TISSUES: No prevertebral soft tissue swelling. LUNGS: Mild paraseptal emphysematous changes at the lung apices. IMPRESSION: 1. No acute findings. 2. Emphysema (ICD10-J43.9). Electronically signed by: Pinkie Pebbles MD 01/04/2025 10:39 PM EST RP Workstation: [id number]   CT Head Wo Contrast Result Date: 01/04/2025 EXAM: CT HEAD WITHOUT CONTRAST 01/04/2025 10:35:03 PM TECHNIQUE: CT of the head was performed without the administration of intravenous contrast. Automated exposure control, iterative reconstruction, and/or weight based adjustment of the mA/kV was utilized to reduce the radiation dose to as low as reasonably achievable. COMPARISON: None available. CLINICAL HISTORY: Head trauma, repeat vomiting (Age 68-64y); seizure vs syncope, fall, head pain. FINDINGS: BRAIN AND VENTRICLES: No acute hemorrhage. No evidence of acute infarct. No hydrocephalus. No extra-axial collection. No mass effect or midline shift. ORBITS: No acute abnormality. SINUSES: No acute abnormality. SOFT TISSUES AND SKULL: No acute soft  tissue abnormality. No skull fracture. IMPRESSION: 1. No acute intracranial abnormality. Electronically signed by: Pinkie Pebbles MD 01/04/2025 10:38 PM EST RP Workstation: [id number]        Scheduled Meds:  enoxaparin  (LOVENOX ) injection  40 mg Subcutaneous Q24H   folic acid   1 mg Oral Daily   LORazepam   0-4 mg Intravenous Q4H   Followed by   NOREEN ON 01/07/2025] LORazepam   0-4 mg Intravenous Q8H   multivitamin with minerals  1 tablet Oral Daily   pantoprazole   40 mg Oral BID   potassium & sodium phosphates   2 packet Oral Q4H   sodium chloride  flush  3 mL Intravenous Q12H   thiamine   100 mg Oral Daily   Or   thiamine   100 mg Intravenous Daily   Continuous Infusions:  cefTRIAXone  (ROCEPHIN )  IV 2 g (01/06/25 0951)   doxycycline  (VIBRAMYCIN ) IV 100 mg (01/06/25 1257)     LOS: 1 day    Time spent: 35 Minutes    Mame Twombly A Romello Hoehn, MD [facility]   If 7PM-7AM, please contact night-coverage [email or web address]  01/06/2025, 2:23 PM   "

## 2025-01-06 NOTE — Progress Notes (Signed)
 "  Rounding Note   Patient Name: Damon Olson Date of Encounter: 01/06/2025  [facility] Cardiologist: None   Subjective - No acute events overnight - No more episodes of syncope - Patient still reports having some GI upset  Scheduled Meds:  enoxaparin  (LOVENOX ) injection  40 mg Subcutaneous Q24H   folic acid   1 mg Oral Daily   LORazepam   0-4 mg Intravenous Q4H   Followed by   NOREEN ON 01/07/2025] LORazepam   0-4 mg Intravenous Q8H   multivitamin with minerals  1 tablet Oral Daily   pantoprazole   40 mg Oral BID   potassium & sodium phosphates   2 packet Oral Q4H   potassium chloride   40 mEq Oral Once   sodium chloride  flush  3 mL Intravenous Q12H   thiamine   100 mg Oral Daily   Or   thiamine   100 mg Intravenous Daily   Continuous Infusions:  cefTRIAXone  (ROCEPHIN )  IV 2 g (01/06/25 0951)   doxycycline  (VIBRAMYCIN ) IV 100 mg (01/05/25 2109)   potassium chloride  10 mEq (01/06/25 0935)   PRN Meds: acetaminophen  **OR** acetaminophen , gadobutrol , gadobutrol , LORazepam  **OR** LORazepam , morphine  injection, prochlorperazine , senna-docusate   Vital Signs  Vitals:   01/06/25 1107 01/06/25 1123 01/06/25 1124 01/06/25 1136  BP: 120/81 111/74 105/74 126/84  Pulse: 84 (!) 102  97  Resp:      Temp:      TempSrc:      SpO2: 98%     Weight:      Height:        Intake/Output Summary (Last 24 hours) at 01/06/2025 1218 Last data filed at 01/06/2025 0600 Gross per 24 hour  Intake 2453.65 ml  Output --  Net 2453.65 ml      01/06/2025    5:00 AM 01/05/2025    6:00 PM 01/04/2025   10:05 PM  Last 3 Weights  Weight (lbs) 135 lb 12.9 oz 138 lb 7.2 oz 145 lb  Weight (kg) 61.6 kg 62.8 kg 65.772 kg      Telemetry NSR- Personally Reviewed  ECG  NSR, slightly prolonged QTc- Personally Reviewed  Physical Exam  GEN: No acute distress.   Neck: No JVD Cardiac: RRR, no murmurs, rubs, or gallops.  Respiratory: Clear to auscultation bilaterally. GI: Soft and non-distended,  mild tenderness to palpation MS: No edema; No deformity. Neuro:  Nonfocal  Psych: Normal affect   Labs High Sensitivity Troponin:  No results for input(s): TROPONINIHS in the last 720 hours. No results for input(s): TRNPT in the last 720 hours.     Chemistry Recent Labs  Lab 01/04/25 2207 01/04/25 2223 01/05/25 0525 01/06/25 0715  NA 132* 132* 134* 136  K 2.6* 2.5* 3.0* 2.5*  CL 91* 95* 96* 102  CO2 23  --  21* 23  GLUCOSE 181* 188* 138* 121*  BUN 45* 50* 35* 17  CREATININE 1.45* 1.40* 1.00 0.81  CALCIUM 9.7  --  9.1 8.9  MG 1.6*  --  2.2 1.6*  PROT 7.3  --  6.5  --   ALBUMIN 3.9  --  3.5  --   AST 74*  --  59*  --   ALT 36  --  30  --   ALKPHOS 130*  --  134*  --   BILITOT 0.7  --  0.5  --   GFRNONAA 60*  --  >60 >60  ANIONGAP 19*  --  17* 12    Lipids No results for input(s): CHOL, TRIG,  HDL, LABVLDL, LDLCALC, CHOLHDL in the last 168 hours.  Hematology Recent Labs  Lab 01/04/25 2207 01/04/25 2223 01/05/25 0525 01/06/25 0715  WBC 12.4*  --  10.7* 8.3  RBC 3.17*  --  2.84* 2.63*  HGB 11.3* 11.9* 10.2* 9.3*  HCT 30.3* 35.0* 27.8* 25.8*  MCV 95.6  --  97.9 98.1  MCH 35.6*  --  35.9* 35.4*  MCHC 37.3*  --  36.7* 36.0  RDW 16.8*  --  17.0* 17.1*  PLT 389  --  357 358   Thyroid  Recent Labs  Lab 01/05/25 1930  TSH 1.130    BNPNo results for input(s): BNP, PROBNP in the last 168 hours.  DDimer No results for input(s): DDIMER in the last 168 hours.   Radiology  MR BRAIN WO CONTRAST Result Date: 01/05/2025 EXAM: MRI Brain Without Contrast 01/05/2025 04:42:02 PM TECHNIQUE: Multiplanar multisequence MRI of the head/brain was performed without the administration of intravenous contrast. COMPARISON: CT head 01/04/25. CLINICAL HISTORY: Syncope/presyncope, cerebrovascular cause suspected FINDINGS: BRAIN AND VENTRICLES: No acute infarct. No intracranial hemorrhage. No mass. No midline shift. No hydrocephalus. The sella is unremarkable. Normal flow  voids. ORBITS: No significant abnormality. SINUSES AND MASTOIDS: No significant abnormality. BONES AND SOFT TISSUES: Normal marrow signal. No soft tissue abnormality. IMPRESSION: 1. No acute abnormality. Electronically signed by: Glendia Molt MD 01/05/2025 05:07 PM EST RP Workstation: [id number]   EEG adult Result Date: 01/05/2025 Shelton Arlin KIDD, MD     01/05/2025  3:16 PM Patient Name: Damon Olson MRN: [id number] Epilepsy Attending: Arlin KIDD Shelton Referring Physician/Provider: Madelyne Owen LABOR, MD Date: 01/05/2025 Duration: 26.03 mins Patient history: 49yo M with transient LOC. EEG to evaluate for seizure Level of alertness: Awake, asleep AEDs during EEG study: Ativan  Technical aspects: This EEG study was done with scalp electrodes positioned according to the 10-20 International system of electrode placement. Electrical activity was reviewed with band pass filter of 1-70Hz , sensitivity of 7 uV/mm, display speed of 27mm/sec with a 60Hz  notched filter applied as appropriate. EEG data were recorded continuously and digitally stored.  Video monitoring was available and reviewed as appropriate. Description: The posterior dominant rhythm consists of 9 Hz activity of moderate voltage (25-35 uV) seen predominantly in posterior head regions, symmetric and reactive to eye opening and eye closing. Sleep was characterized by vertex waves, sleep spindles (12 to 14 Hz), maximal frontocentral region. Hyperventilation and photic stimulation were not performed.   IMPRESSION: This study is within normal limits. No seizures or epileptiform discharges were seen throughout the recording. A normal interictal EEG does not exclude the diagnosis of epilepsy. Arlin KIDD Shelton   ECHOCARDIOGRAM COMPLETE Result Date: 01/05/2025    ECHOCARDIOGRAM REPORT   Patient Name:   Damon Olson Date of Exam: 01/05/2025 Medical Rec #:  [id number]     Height:       73.0 in Accession #:    [id number]    Weight:       145.0 lb Date of Birth:   05-18-1976     BSA:          1.877 m Patient Age:    49 years      BP:           131/91 mmHg Patient Gender: M             HR:           100 bpm. Exam Location:  Outpatient Procedure: 2D Echo, Color Doppler and Cardiac Doppler (Both Spectral  and Color            Flow Doppler were utilized during procedure). Indications:    Syncope R55  History:        Patient has no prior history of Echocardiogram examinations.  Sonographer:    Tinnie Gosling RDCS Referring Phys: [phone] TIMOTHY S OPYD IMPRESSIONS  1. Left ventricular ejection fraction, by estimation, is 45 to 50%. The left ventricle has mildly decreased function. The left ventricle demonstrates regional wall motion abnormalities (see scoring diagram/findings for description). Left ventricular diastolic parameters were normal.  2. Right ventricular systolic function is normal. The right ventricular size is normal. Tricuspid regurgitation signal is inadequate for assessing PA pressure.  3. The mitral valve is grossly normal. No evidence of mitral valve regurgitation. No evidence of mitral stenosis.  4. The aortic valve is tricuspid. Aortic valve regurgitation is not visualized. No aortic stenosis is present.  5. The inferior vena cava is normal in size with greater than 50% respiratory variability, suggesting right atrial pressure of 3 mmHg. Comparison(s): No prior Echocardiogram. FINDINGS  Left Ventricle: Left ventricular ejection fraction, by estimation, is 45 to 50%. The left ventricle has mildly decreased function. The left ventricle demonstrates regional wall motion abnormalities. The left ventricular internal cavity size was normal in size. There is no left ventricular hypertrophy. Left ventricular diastolic parameters were normal.  LV Wall Scoring: The entire septum is hypokinetic. Right Ventricle: The right ventricular size is normal. Right vetricular wall thickness was not assessed. Right ventricular systolic function is normal. Tricuspid regurgitation signal  is inadequate for assessing PA pressure. Left Atrium: Left atrial size was normal in size. Right Atrium: Right atrial size was not assessed. Pericardium: There is no evidence of pericardial effusion. Mitral Valve: The mitral valve is grossly normal. No evidence of mitral valve regurgitation. No evidence of mitral valve stenosis. Tricuspid Valve: The tricuspid valve is normal in structure. Tricuspid valve regurgitation is trivial. No evidence of tricuspid stenosis. Aortic Valve: The aortic valve is tricuspid. Aortic valve regurgitation is not visualized. No aortic stenosis is present. Pulmonic Valve: The pulmonic valve was normal in structure. Pulmonic valve regurgitation is trivial. No evidence of pulmonic stenosis. Aorta: The aortic root and ascending aorta are structurally normal, with no evidence of dilitation. Venous: The inferior vena cava is normal in size with greater than 50% respiratory variability, suggesting right atrial pressure of 3 mmHg. IAS/Shunts: The atrial septum is grossly normal.  LEFT VENTRICLE PLAX 2D LVIDd:         4.50 cm     Diastology LVIDs:         3.40 cm     LV e' medial:    7.29 cm/s LV PW:         0.90 cm     LV E/e' medial:  8.8 LV IVS:        0.80 cm     LV e' lateral:   13.50 cm/s LVOT diam:     2.10 cm     LV E/e' lateral: 4.7 LV SV:         54 LV SV Index:   29 LVOT Area:     3.46 cm  LV Volumes (MOD) LV vol d, MOD A2C: 72.2 ml LV vol d, MOD A4C: 87.4 ml LV vol s, MOD A2C: 46.9 ml LV vol s, MOD A4C: 43.7 ml LV SV MOD A2C:     25.3 ml LV SV MOD A4C:     87.4 ml LV SV  MOD BP:      36.4 ml RIGHT VENTRICLE             IVC RV S prime:     14.50 cm/s  IVC diam: 1.30 cm TAPSE (M-mode): 1.5 cm                             PULMONARY VEINS                             Diastolic Velocity: 45.70 cm/s                             S/D Velocity:       1.60                             Systolic Velocity:  70.90 cm/s LEFT ATRIUM           Index       RIGHT ATRIUM           Index LA diam:      2.60 cm  1.39 cm/m  RA Area:     15.80 cm LA Vol (A4C): 14.9 ml 7.94 ml/m  RA Volume:   44.80 ml  23.87 ml/m  AORTIC VALVE LVOT Vmax:   99.80 cm/s LVOT Vmean:  74.200 cm/s LVOT VTI:    0.155 m  AORTA Ao Root diam: 3.60 cm Ao Asc diam:  3.50 cm Ao Desc diam: 2.80 cm MITRAL VALVE MV Area (PHT): 4.86 cm    SHUNTS MV Decel Time: 156 msec    Systemic VTI:  0.16 m MV E velocity: 63.90 cm/s  Systemic Diam: 2.10 cm MV A velocity: 67.60 cm/s MV E/A ratio:  0.95 Sunit Tolia Electronically signed by Madonna Large Signature Date/Time: 01/05/2025/2:27:06 PM    Final    DG Chest Port 1 View Result Date: 01/04/2025 EXAM: 1 VIEW(S) XRAY OF THE CHEST 01/04/2025 10:58:27 PM COMPARISON: 12/28/2017 CLINICAL HISTORY: Questionable sepsis - evaluate for abnormality. FINDINGS: LUNGS AND PLEURA: Bibasilar airspace opacities, left greater than right, concerning for pneumonia. No pleural effusion. No pneumothorax. HEART AND MEDIASTINUM: No acute abnormality of the cardiac and mediastinal silhouettes. BONES AND SOFT TISSUES: No acute osseous abnormality. IMPRESSION: 1. Bibasilar airspace opacities, left greater than right, suspicious for pneumonia. Electronically signed by: Franky Crease MD 01/04/2025 11:01 PM EST RP Workstation: [id number]   CT Cervical Spine Wo Contrast Result Date: 01/04/2025 EXAM: CT CERVICAL SPINE WITHOUT CONTRAST 01/04/2025 10:35:03 PM TECHNIQUE: CT of the cervical spine was performed without the administration of intravenous contrast. Multiplanar reformatted images are provided for review. Automated exposure control, iterative reconstruction, and/or weight based adjustment of the mA/kV was utilized to reduce the radiation dose to as low as reasonably achievable. COMPARISON: None available. CLINICAL HISTORY: Neck trauma, midline tenderness (Age 45-64y). FINDINGS: BONES AND ALIGNMENT: No acute fracture or traumatic malalignment. DEGENERATIVE CHANGES: No significant degenerative changes. SOFT TISSUES: No prevertebral soft  tissue swelling. LUNGS: Mild paraseptal emphysematous changes at the lung apices. IMPRESSION: 1. No acute findings. 2. Emphysema (ICD10-J43.9). Electronically signed by: Pinkie Pebbles MD 01/04/2025 10:39 PM EST RP Workstation: [id number]   CT Head Wo Contrast Result Date: 01/04/2025 EXAM: CT HEAD WITHOUT CONTRAST 01/04/2025 10:35:03 PM TECHNIQUE: CT of the head was performed without the administration of intravenous contrast. Automated exposure control,  iterative reconstruction, and/or weight based adjustment of the mA/kV was utilized to reduce the radiation dose to as low as reasonably achievable. COMPARISON: None available. CLINICAL HISTORY: Head trauma, repeat vomiting (Age 59-64y); seizure vs syncope, fall, head pain. FINDINGS: BRAIN AND VENTRICLES: No acute hemorrhage. No evidence of acute infarct. No hydrocephalus. No extra-axial collection. No mass effect or midline shift. ORBITS: No acute abnormality. SINUSES: No acute abnormality. SOFT TISSUES AND SKULL: No acute soft tissue abnormality. No skull fracture. IMPRESSION: 1. No acute intracranial abnormality. Electronically signed by: Pinkie Pebbles MD 01/04/2025 10:38 PM EST RP Workstation: [id number]    Cardiac Studies   TTE 01/05/25:  IMPRESSIONS     1. Left ventricular ejection fraction, by estimation, is 45 to 50%. The  left ventricle has mildly decreased function. The left ventricle  demonstrates regional wall motion abnormalities (see scoring  diagram/findings for description). Left ventricular  diastolic parameters were normal.   2. Right ventricular systolic function is normal. The right ventricular  size is normal. Tricuspid regurgitation signal is inadequate for assessing  PA pressure.   3. The mitral valve is grossly normal. No evidence of mitral valve  regurgitation. No evidence of mitral stenosis.   4. The aortic valve is tricuspid. Aortic valve regurgitation is not  visualized. No aortic stenosis is present.   5. The  inferior vena cava is normal in size with greater than 50%  respiratory variability, suggesting right atrial pressure of 3 mmHg.   Patient Profile   ANAS REISTER is a 49 y.o. male with a hx of anxiety, depression, asthma, alcohol abuse,  who is being seen 01/05/2025 for the evaluation of syncope  at the request of Dr Madelyne.   Assessment & Plan    #Syncope #Prolonged Qtc #Hypokalemia/Hypomagnesemia - Patient presented with a true episode of syncope with a prodrome of several days of intense nausea and vomiting and decreased p.o. intake. - I completed the med rec of the patient and he is taking citalopram  20 mg daily and trazodone  50 mg nightly for anxiety. - He was found to be severely hypokalemic and hypomagnesemic on admission which is probably the result of his nausea and vomiting. - The patient's QTc was 538 on admission. - Brain MRI and EEG were unremarkable. - Although I do not know exactly the cause of his syncope, I do wonder if he had a ventricular arrhythmia in the setting of a prolonged QT and multiple electrolyte derangements causing him to pass out.  The other consideration is withdrawal from EtOH since he was not able to consume much EtOH over the past few days given his GI symptoms. - I agree with holding any QTc prolonging medications at this time. - The patient should also abstain from driving for 6 months. - Will also send the patient home with a heart monitor prior to discharge. Stress cardiac MRI today Aggressive electrolyte repletion Avoid QTc prolonging medications at this time Daily EKG The patient will need a heart monitor prior to discharge Maintain telemetry   #Mildly Reduced LVEF - Patient noted to have mildly reduced LVEF 45-50% by TTE obtained to evaluate for etiology of syncope.  - He has no clinical signs of CHF. 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 - Will obtain a stress  cardiac MRI to 1) rule out an ischemic etiology to his EF reduction and 2) better quantify his LVEF and evaluate any possible cause for  NICM. - The patient had an EEG ruling out evidence of seizure and his asthma is well-controlled. -Will await the results of the LVEF obtained from the cardiac MRI and initiate GDMT accordingly. Stress cardiac MRI today Plan to start some low-dose GDMT depending on the results of the MRI       For questions or updates, please contact [facility] Please consult [email or web address] for contact info under       Signed, Georganna Archer, MD  01/06/2025, 12:18 PM    "

## 2025-01-06 NOTE — TOC Initial Note (Signed)
 Transition of Care [facility]) - Initial/Assessment Note    Patient Details  Name: Damon Olson MRN: [id number] Date of Birth: [birth year]  Transition of Care [facility] CM/SW Contact:    Andrez JULIANNA George, RN Phone Number: 01/06/2025, 10:26 AM  Clinical Narrative:                 Damon Olson is a 49 y.o. male with medical history significant for depression, anxiety, and alcohol abuse, now presenting after loss of consciousness.   Pt is from [place]  but is most likely going to now stay in [place]. He has been either staying in a motel or with a friend.  Pt says he takes his medications but per pharmacy not sure when last doses.  Pt drives self.  No PCP here in [place]. Pt does have insurance and CM sent a copy of the card to admissions. Pt interested in getting set up with [facility] for PCP as he has been to see them in the past. Appt on AVS.   ICM following for dc needs.   Expected Discharge Plan: Home/Self Care Barriers to Discharge: Continued Medical Work up   Patient Goals and CMS Choice            Expected Discharge Plan and Services   Discharge Planning Services: CM Consult   Living arrangements for the past 2 months: Hotel/Motel                                      Prior Living Arrangements/Services Living arrangements for the past 2 months: Hotel/Motel Lives with:: Self Patient language and need for interpreter reviewed:: Yes Do you feel safe going back to the place where you live?: Yes        Care giver support system in place?: No (comment) Current home services: DME (cane) Criminal Activity/Legal Involvement Pertinent to Current Situation/Hospitalization: No - Comment as needed  Activities of Daily Living   ADL Screening (condition at time of admission) Independently performs ADLs?: Yes (appropriate for developmental age) Is the patient deaf or have difficulty hearing?: No Does the patient have difficulty seeing, even when wearing glasses/contacts?: No Does  the patient have difficulty concentrating, remembering, or making decisions?: No  Permission Sought/Granted                  Emotional Assessment Appearance:: Appears stated age Attitude/Demeanor/Rapport: Engaged Affect (typically observed): Accepting Orientation: : Oriented to Self, Oriented to Place, Oriented to  Time, Oriented to Situation Alcohol / Substance Use: Alcohol Use Psych Involvement: No (comment)  Admission diagnosis:  Syncope and collapse [R55] Hypokalemia [E87.6] Hypomagnesemia [E83.42] AKI (acute kidney injury) [N17.9] QT prolongation [R94.31] Pneumonia of both lower lobes due to infectious organism [J18.9] Transient LOC (loss of consciousness) [R55] Patient Active Problem List   Diagnosis Date Noted   Syncope and collapse 01/05/2025   Hypokalemia 01/05/2025   Hypomagnesemia 01/05/2025   Pneumonia 01/05/2025   Elevated transaminase level 01/05/2025   Prolonged QT interval 01/05/2025   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, [address] [place] [address] Phone: [phone] Fax: [phone]     Social Drivers of Health (SDOH) Social History: SDOH Screenings   Food Insecurity: No Food Insecurity (01/05/2025)  Housing: Low Risk (01/05/2025)  Transportation Needs: No Transportation Needs (01/05/2025)  Utilities: Not At Risk (01/05/2025)  Depression (PHQ2-9): Medium Risk (12/31/2021)  Tobacco Use: High Risk  (01/05/2025)   SDOH Interventions:     Readmission Risk Interventions     No data to display

## 2025-01-06 NOTE — Progress Notes (Signed)
 Pt is back to the unit.

## 2025-01-06 NOTE — Plan of Care (Signed)
" °  Problem: Education: Goal: Knowledge of condition and prescribed therapy will improve Outcome: Progressing   Problem: Physical Regulation: Goal: Complications related to the disease process, condition or treatment will be avoided or minimized Outcome: Progressing   Problem: Education: Goal: Knowledge of General Education information will improve Description: Including pain rating scale, medication(s)/side effects and non-pharmacologic comfort measures Outcome: Progressing   Problem: Clinical Measurements: Goal: Respiratory complications will improve Outcome: Progressing Goal: Cardiovascular complication will be avoided Outcome: Progressing   "

## 2025-01-06 NOTE — Plan of Care (Addendum)
 Pt alert and oriented, VS stable, had 3 large loose BM with abd pain,  lab sent for C-diff screen and GI panel, plan of care continued. Problem: Education: Goal: Knowledge of condition and prescribed therapy will improve Outcome: Progressing   Problem: Health Behavior/Discharge Planning: Goal: Ability to manage health-related needs will improve Outcome: Progressing   Problem: Clinical Measurements: Goal: Respiratory complications will improve Outcome: Progressing Goal: Cardiovascular complication will be avoided Outcome: Progressing   Problem: Activity: Goal: Risk for activity intolerance will decrease Outcome: Progressing   Problem: Nutrition: Goal: Adequate nutrition will be maintained Outcome: Progressing   Problem: Coping: Goal: Level of anxiety will decrease Outcome: Progressing   Problem: Elimination: Goal: Will not experience complications related to urinary retention Outcome: Progressing   Problem: Skin Integrity: Goal: Risk for impaired skin integrity will decrease Outcome: Progressing   Problem: Activity: Goal: Ability to tolerate increased activity will improve Outcome: Progressing   Problem: Clinical Measurements: Goal: Ability to maintain a body temperature in the normal range will improve Outcome: Progressing

## 2025-01-06 NOTE — Progress Notes (Signed)
Pt is off the unit for MRI

## 2025-01-07 ENCOUNTER — Inpatient Hospital Stay (HOSPITAL_COMMUNITY): Payer: Self-pay

## 2025-01-07 LAB — PHOSPHORUS: Phosphorus: 1.3 mg/dL — ABNORMAL LOW (ref 2.5–4.6)

## 2025-01-07 LAB — BASIC METABOLIC PANEL WITH GFR
Anion gap: 10 (ref 5–15)
BUN: 9 mg/dL (ref 6–20)
CO2: 24 mmol/L (ref 22–32)
Calcium: 8.8 mg/dL — ABNORMAL LOW (ref 8.9–10.3)
Chloride: 104 mmol/L (ref 98–111)
Creatinine, Ser: 0.68 mg/dL (ref 0.61–1.24)
GFR, Estimated: >60 mL/min
Glucose, Bld: 139 mg/dL — ABNORMAL HIGH (ref 70–99)
Potassium: 3.2 mmol/L — ABNORMAL LOW (ref 3.5–5.1)
Sodium: 138 mmol/L (ref 135–145)

## 2025-01-07 LAB — GASTROINTESTINAL PANEL BY PCR, STOOL (REPLACES STOOL CULTURE)

## 2025-01-07 LAB — MAGNESIUM: Magnesium: 1.3 mg/dL — ABNORMAL LOW (ref 1.7–2.4)

## 2025-01-07 LAB — LIPASE, BLOOD: Lipase: 135 U/L — ABNORMAL HIGH (ref 11–51)

## 2025-01-07 LAB — GLUCOSE, CAPILLARY: Glucose-Capillary: 119 mg/dL — ABNORMAL HIGH (ref 70–99)

## 2025-01-07 MED ORDER — MAGNESIUM SULFATE 2 GM/50ML IV SOLN
2.0000 g | Freq: Once | INTRAVENOUS | Status: AC
  Administered 2025-01-07: 2 g via INTRAVENOUS
  Filled 2025-01-07: qty 50

## 2025-01-07 MED ORDER — CHLORDIAZEPOXIDE HCL 5 MG PO CAPS
25.0000 mg | ORAL_CAPSULE | Freq: Two times a day (BID) | ORAL | Status: DC
  Administered 2025-01-07 – 2025-01-12 (×10): 25 mg via ORAL
  Filled 2025-01-07 (×10): qty 5

## 2025-01-07 MED ORDER — METOPROLOL SUCCINATE ER 25 MG PO TB24
25.0000 mg | ORAL_TABLET | Freq: Every day | ORAL | Status: DC
  Administered 2025-01-07 – 2025-01-12 (×6): 25 mg via ORAL
  Filled 2025-01-07 (×6): qty 1

## 2025-01-07 MED ORDER — DOXYCYCLINE HYCLATE 100 MG PO TABS
100.0000 mg | ORAL_TABLET | Freq: Two times a day (BID) | ORAL | Status: AC
  Administered 2025-01-07 – 2025-01-09 (×5): 100 mg via ORAL
  Filled 2025-01-07 (×5): qty 1

## 2025-01-07 MED ORDER — SUCRALFATE 1 GM/10ML PO SUSP
1.0000 g | Freq: Three times a day (TID) | ORAL | Status: DC
  Administered 2025-01-07 – 2025-01-12 (×20): 1 g via ORAL
  Filled 2025-01-07 (×25): qty 10

## 2025-01-07 MED ORDER — IOHEXOL 9 MG/ML PO SOLN
500.0000 mL | ORAL | Status: AC
  Administered 2025-01-07 (×2): 500 mL via ORAL

## 2025-01-07 MED ORDER — POTASSIUM CHLORIDE CRYS ER 20 MEQ PO TBCR
40.0000 meq | EXTENDED_RELEASE_TABLET | ORAL | Status: AC
  Administered 2025-01-07 (×2): 40 meq via ORAL
  Filled 2025-01-07 (×2): qty 2

## 2025-01-07 MED ORDER — LOSARTAN POTASSIUM 25 MG PO TABS
25.0000 mg | ORAL_TABLET | Freq: Every day | ORAL | Status: DC
  Administered 2025-01-07 – 2025-01-12 (×6): 25 mg via ORAL
  Filled 2025-01-07 (×6): qty 1

## 2025-01-07 NOTE — Progress Notes (Signed)
 Pt is off the unit for Abdominal CT.

## 2025-01-07 NOTE — Progress Notes (Signed)
 Pt is back to unit, check in completed.

## 2025-01-07 NOTE — Plan of Care (Signed)
" °  Problem: Health Behavior/Discharge Planning: Goal: Ability to manage health-related needs will improve 01/07/2025 0522 by Leobardo Zada Ronnald GORMAN, RN Outcome: Progressing 01/07/2025 0522 by Leobardo Zada Ronnald GORMAN, RN Outcome: Progressing   Problem: Clinical Measurements: Goal: Will remain free from infection 01/07/2025 0522 by Leobardo Zada Ronnald GORMAN, RN Outcome: Progressing 01/07/2025 0522 by Leobardo Zada Ronnald GORMAN, RN Outcome: Progressing   Problem: Clinical Measurements: Goal: Respiratory complications will improve 01/07/2025 0522 by Leobardo Zada Ronnald GORMAN, RN Outcome: Progressing 01/07/2025 0522 by Leobardo Zada Ronnald GORMAN, RN Outcome: Progressing   Problem: Activity: Goal: Risk for activity intolerance will decrease 01/07/2025 0522 by Leobardo Zada Ronnald GORMAN, RN Outcome: Progressing 01/07/2025 0522 by Leobardo Zada Ronnald GORMAN, RN Outcome: Progressing   Problem: Nutrition: Goal: Adequate nutrition will be maintained 01/07/2025 0522 by Leobardo Zada Ronnald GORMAN, RN Outcome: Progressing 01/07/2025 0522 by Leobardo Zada Ronnald GORMAN, RN Outcome: Progressing   Problem: Pain Managment: Goal: General experience of comfort will improve and/or be controlled 01/07/2025 0522 by Leobardo Zada Ronnald GORMAN, RN Outcome: Progressing 01/07/2025 0522 by Leobardo Zada Ronnald GORMAN, RN Outcome: Progressing   Problem: Safety: Goal: Ability to remain free from injury will improve Outcome: Progressing   "

## 2025-01-07 NOTE — Progress Notes (Signed)
"  °  Progress Note  Patient Name: Damon Olson Date of Encounter: 01/07/2025 [facility] Cardiologist: Georganna Archer, MD   Interval Summary   No new CV events, no arrhythmia on telemetry  Vital Signs Vitals:   01/07/25 0717 01/07/25 0918 01/07/25 1126 01/07/25 1416  BP: 132/87  138/83 (!) 144/101  Pulse: 86 90 95 92  Resp: 19  19   Temp: 97.8 F (36.6 C)  97.9 F (36.6 C)   TempSrc: Oral  Oral   SpO2: 100%  100%   Weight:      Height:        Intake/Output Summary (Last 24 hours) at 01/07/2025 1424 Last data filed at 01/07/2025 0000 Gross per 24 hour  Intake 1215.79 ml  Output --  Net 1215.79 ml      01/07/2025    5:00 AM 01/06/2025    5:00 AM 01/05/2025    6:00 PM  Last 3 Weights  Weight (lbs) 135 lb 12.9 oz 135 lb 12.9 oz 138 lb 7.2 oz  Weight (kg) 61.6 kg 61.6 kg 62.8 kg      Telemetry/ECG  NSR - Personally Reviewed  Physical Exam  GEN: No acute distress.   Neck: No JVD Cardiac: RRR, no murmurs, rubs, or gallops.  Respiratory: Clear to auscultation bilaterally. GI: Soft, nontender, non-distended  MS: No edema  Assessment & Plan  Mildly depressed LVEF on CMRI, no evidence of other structural abnormalities, no scar on LGE, no ischemia on stress images. Start low dose losartan  and metoprolol  succinate. Stop alcohol, most likely etiology for his cardiomyopathy    For questions or updates, please contact [facility] Please consult [email or web address] for contact info under         Signed, Jerel Balding, MD   "

## 2025-01-07 NOTE — Progress Notes (Signed)
 The patient is receiving Doxycycline  by the intravenous route.  Based on criteria approved by the Pharmacy and Therapeutics Committee and the Medical Executive Committee, the medication is being converted to the equivalent oral dose form.  These criteria include: -Has received at least 48 hours of IV therapy -Afebrile x 24 hours or more -Tolerating an oral diet -Clinical assessment of improvement  If you have any questions about this conversion, please contact the Pharmacy Department (phone [phone]).  Thank you.  R. Samual Satterfield, PharmD, RPh PGY-1 Acute Care Pharmacy Resident [facility] Please refer to [facility] for [facility] numbers 01/07/2025 12:49 PM

## 2025-01-07 NOTE — Plan of Care (Addendum)
 Patient remained stable throughout the day, had one episode of loose BM, occasional abd pain but pt states it is getting better than before, Vs stable, plan of care continued Problem: Education: Goal: Knowledge of condition and prescribed therapy will improve Outcome: Progressing   Problem: Education: Goal: Knowledge of General Education information will improve Description: Including pain rating scale, medication(s)/side effects and non-pharmacologic comfort measures Outcome: Progressing   Problem: Clinical Measurements: Goal: Respiratory complications will improve Outcome: Progressing Goal: Cardiovascular complication will be avoided Outcome: Progressing   Problem: Activity: Goal: Risk for activity intolerance will decrease Outcome: Progressing   Problem: Nutrition: Goal: Adequate nutrition will be maintained Outcome: Progressing   Problem: Elimination: Goal: Will not experience complications related to bowel motility Outcome: Progressing Goal: Will not experience complications related to urinary retention Outcome: Progressing

## 2025-01-07 NOTE — Progress Notes (Signed)
 " PROGRESS NOTE    Damon Olson  FMW:[id number] DOB: 05-Aug-1976 DOA: 01/04/2025 PCP: Pcp, No   Brief Narrative: 49 year old with past medical history significant for depression, anxiety, alcohol abuse presented after loss of consciousness.  Patient reports a history of nausea vomiting lower abdominal pain on 1/17 symptoms resolved by 1/19.  He normally drinks a sixpack of beer every night after work, but has not had any alcohol in 3 days.  Denies any history of seizures.  Per EMS report patient was incontinent in urine.  Evaluation in the ED lactic acid 2.5, white blood cell 12, creatinine 1.5, magnesium  1.6 potassium 2.6.  CT head and cervical spine no acute findings.  Chest x-ray bibasilar airspace opacity concerning for pneumonia.  Assessment & Plan:   Principal Problem:   Syncope and collapse Active Problems:   Unspecified mood (affective) disorder   Hypokalemia   Hypomagnesemia   Pneumonia   Prolonged QT interval  1-Syncope versus Seizure - Presented after brief loss of consciousness, had urinary incontinence per EMS. -suspect Syncope in setting hypotension and severe hypokalemia.  -FU ECHO: abnormal ECHO EF 45--50 %, regional wall motion abnormalities. Will consult cardiology  -Treated  with IV fluids. Replete electrolytes.  MRI: negative for Stroke.  EEG negative for seizure.    Pneumonia: - He reported productive cough, SIRS criteria, chest x-ray bibasilar infiltrates, leukocytosis white blood cell 12. -COVID, RSV, influenza negative. -Blood cultures no growth to date. - Continue IV antibiotics  Alcohol abuse: - Monitor on CIWA  Cardiomyopathy;  EF 45 % Suspect ETOH related.  Cardiac MRI ordered. Negative for reversible ischemia.  Appreciate cardiology evaluation.  Started on Cozaar .   Hypokalemia, hypomagnesemia, Hypophosphatemia.  - Received IV and oral supplement.  -replete mg and phosphorus.  -Continue to replete K   Prolonged QT: -Correct  electrolyte  Weakness; generalized. FTT,   -B12 normal , TSH: 1.1, Thiamine  pending.  -nutrition consultation   Diarrhea abdominal pain.  GI pathogen.  Check CT abdomen pelvis Lipase level.  C diff negative.   Gastritis, esophagitis; probably form Alcohol.  Continue PPI Add Carafate .   Estimated body mass index is 18.42 kg/m as calculated from the following:   Height as of this encounter: 6' (1.829 m).   Weight as of this encounter: 61.6 kg.   DVT prophylaxis: Lovenox  Code Status: Full code Family Communication: care dicussed with patient.  Disposition Plan:  Status is: Inpatient Remains inpatient appropriate because: management of syncope.     Consultants:  Cardiology  Procedures:  ECHO  Antimicrobials:    Subjective: Had 3 BM yesterday. One BM today watery.  Report abdominal pain, mid abdomen burning.    Objective: Vitals:   01/07/25 0500 01/07/25 0717 01/07/25 0918 01/07/25 1126  BP:  132/87  138/83  Pulse:  86 90 95  Resp:  19  19  Temp:  97.8 F (36.6 C)  97.9 F (36.6 C)  TempSrc:  Oral  Oral  SpO2:  100%  100%  Weight: 61.6 kg     Height:        Intake/Output Summary (Last 24 hours) at 01/07/2025 1355 Last data filed at 01/07/2025 0000 Gross per 24 hour  Intake 1215.79 ml  Output --  Net 1215.79 ml   Filed Weights   01/05/25 1800 01/06/25 0500 01/07/25 0500  Weight: 62.8 kg 61.6 kg 61.6 kg    Examination:  General exam: NAD  Respiratory system: CTA CVS; S ,1 S 2  RRR Gastrointestinal system: BBS present,  soft, nt Central nervous system:Alert Extremities: no edema  Data Reviewed: I have personally reviewed following labs and imaging studies  CBC: Recent Labs  Lab 01/04/25 2207 01/04/25 2223 01/05/25 0525 01/06/25 0715  WBC 12.4*  --  10.7* 8.3  NEUTROABS 10.4*  --   --   --   HGB 11.3* 11.9* 10.2* 9.3*  HCT 30.3* 35.0* 27.8* 25.8*  MCV 95.6  --  97.9 98.1  PLT 389  --  357 358   Basic Metabolic Panel: Recent Labs   Lab 01/04/25 2207 01/04/25 2223 01/05/25 0525 01/06/25 0715 01/06/25 1441 01/07/25 0831  NA 132* 132* 134* 136 137 138  K 2.6* 2.5* 3.0* 2.5* 3.2* 3.2*  CL 91* 95* 96* 102 102 104  CO2 23  --  21* 23 25 24   GLUCOSE 181* 188* 138* 121* 160* 139*  BUN 45* 50* 35* 17 13 9   CREATININE 1.45* 1.40* 1.00 0.81 0.76 0.68  CALCIUM 9.7  --  9.1 8.9 8.9 8.8*  MG 1.6*  --  2.2 1.6*  --  1.3*  PHOS  --   --  2.5 1.1*  --   --    GFR: Estimated Creatinine Clearance: 99.5 mL/min (by C-G formula based on SCr of 0.68 mg/dL). Liver Function Tests: Recent Labs  Lab 01/04/25 2207 01/05/25 0525  AST 74* 59*  ALT 36 30  ALKPHOS 130* 134*  BILITOT 0.7 0.5  PROT 7.3 6.5  ALBUMIN 3.9 3.5   No results for input(s): LIPASE, AMYLASE in the last 168 hours. No results for input(s): AMMONIA in the last 168 hours. Coagulation Profile: Recent Labs  Lab 01/04/25 2252  INR 1.0   Cardiac Enzymes: No results for input(s): CKTOTAL, CKMB, CKMBINDEX, TROPONINI in the last 168 hours. BNP (last 3 results) No results for input(s): PROBNP in the last 8760 hours. HbA1C: No results for input(s): HGBA1C in the last 72 hours. CBG: Recent Labs  Lab 01/04/25 2240 01/05/25 0536 01/06/25 0759 01/07/25 0537  GLUCAP 179* 134* 143* 119*   Lipid Profile: No results for input(s): CHOL, HDL, LDLCALC, TRIG, CHOLHDL, LDLDIRECT in the last 72 hours. Thyroid Function Tests: Recent Labs    01/05/25 1930  TSH 1.130   Anemia Panel: Recent Labs    01/05/25 1930  VITAMINB12 692  FOLATE 16.8   Sepsis Labs: Recent Labs  Lab 01/04/25 2220 01/05/25 0026  LATICACIDVEN 2.5* 0.7    Recent Results (from the past 240 hours)  Resp panel by RT-PCR (RSV, Flu A&B, Covid) Urine, Clean Catch     Status: None   Collection Time: 01/04/25 10:17 PM   Specimen: Urine, Clean Catch; Nasal Swab  Result Value Ref Range Status   SARS Coronavirus 2 by RT PCR NEGATIVE NEGATIVE Final   Influenza  A by PCR NEGATIVE NEGATIVE Final   Influenza B by PCR NEGATIVE NEGATIVE Final    Comment: (NOTE) The Xpert Xpress SARS-CoV-2/FLU/RSV plus assay is intended as an aid in the diagnosis of influenza from Nasopharyngeal swab specimens and should not be used as a sole basis for treatment. Nasal washings and aspirates are unacceptable for Xpert Xpress SARS-CoV-2/FLU/RSV testing.  Fact Sheet for Patients: bloggercourse.com  Fact Sheet for Healthcare Providers: seriousbroker.it  This test is not yet approved or cleared by the [place]  FDA and has been authorized for detection and/or diagnosis of SARS-CoV-2 by FDA under an Emergency Use Authorization (EUA). This EUA will remain in effect (meaning this test can be used) for the duration of the  COVID-19 declaration under Section 564(b)(1) of the Act, 21 U.S.C. section 360bbb-3(b)(1), unless the authorization is terminated or revoked.     Resp Syncytial Virus by PCR NEGATIVE NEGATIVE Final    Comment: (NOTE) Fact Sheet for Patients: bloggercourse.com  Fact Sheet for Healthcare Providers: seriousbroker.it  This test is not yet approved or cleared by the [place]  FDA and has been authorized for detection and/or diagnosis of SARS-CoV-2 by FDA under an Emergency Use Authorization (EUA). This EUA will remain in effect (meaning this test can be used) for the duration of the COVID-19 declaration under Section 564(b)(1) of the Act, 21 U.S.C. section 360bbb-3(b)(1), unless the authorization is terminated or revoked.  Performed at [facility], [address], [place], [place] [address]   Culture, blood (routine x 2)     Status: None (Preliminary result)   Collection Time: 01/04/25 10:52 PM   Specimen: BLOOD LEFT FOREARM  Result Value Ref Range Status   Specimen Description BLOOD LEFT FOREARM  Final   Special Requests   Final     BOTTLES DRAWN AEROBIC AND ANAEROBIC Blood Culture results may not be optimal due to an inadequate volume of blood received in culture bottles   Culture   Final    NO GROWTH 3 DAYS Performed at [facility], [address], [place], [place] [address]    Report Status PENDING  Incomplete  Culture, blood (routine x 2)     Status: None (Preliminary result)   Collection Time: 01/04/25 10:55 PM   Specimen: BLOOD  Result Value Ref Range Status   Specimen Description BLOOD SITE NOT SPECIFIED  Final   Special Requests   Final    BOTTLES DRAWN AEROBIC AND ANAEROBIC Blood Culture adequate volume   Culture   Final    NO GROWTH 3 DAYS Performed at [facility], [address], [place], [place] [address]    Report Status PENDING  Incomplete  C Difficile Quick Screen w PCR reflex     Status: None   Collection Time: 01/06/25 12:35 PM   Specimen: STOOL  Result Value Ref Range Status   C Diff antigen NEGATIVE NEGATIVE Final   C Diff toxin NEGATIVE NEGATIVE Final   C Diff interpretation No C. difficile detected.  Final    Comment: Performed at [facility], [address], [place], [place] [address]         Radiology Studies: MR CARDIAC STRESS TEST Result Date: 01/06/2025 CLINICAL DATA:  Stress CMR to evaluate for ischemia. 66M presented with syncope. LVEF 45-50% on echo EXAM: CARDIAC MRI PROCEDURE: PROCEDURE The patient was scanned on a 1.5 Tesla Siemens magnet. A dedicated cardiac coil was used. Functional imaging was done using Fiesta sequences. 2,3, and 4 chamber views were done to assess for RWMA's. Modified Simpson's rule using a short axis stack was used to calculate an ejection fraction on a dedicated work [employer]. Rest and stress (following administration of regadenoson  0.4mg ) first-pass contrast-enhanced imaging was done. The patient received 9 cc of Gadavist . After 10 minutes inversion recovery sequences were used to assess for infiltration and scar  tissue. Phase contrast velocity mapping was performed above the aortic and pulmonic valves CONTRAST:  9 cc  of Gadavist  FINDINGS: Left ventricle: -No hypertrophy -Normal size -Low normal systolic function -Elevated ECV (36%), though question accuracy with significant motion artifact on T1 mapping sequences -Normal T2 values -No ischemia on stress perfusion imaging -No LGE LV EF: 52% (  Normal 56-78%) Absolute volumes: LV EDV: (Normal 77-195 mL) LV ESV: 66mL (Normal 19-72 mL) LV SV: 70mL (Normal 51-133 mL) CO: 6.8L/min (Normal 2.8-8.8 L/min) Indexed volumes: LV EDV: 86mL/sq-m (Normal 47-92 mL/sq-m) LV ESV: 52mL/sq-m (Normal 13-30 mL/sq-m) LV SV: 85mL/sq-m (Normal 32-62 mL/sq-m) CI: 3.8L/min/sq-m (Normal 1.7-4.2 L/min/sq-m) Right ventricle: Normal size with mild systolic dysfunction RV EF:  48% (Normal 47-74%) Absolute volumes: RV EDV: (Normal 88-227 mL) RV ESV: 85mL (Normal 23-103 mL) RV SV: 77mL (Normal 52-138 mL) CO: 7.4L/min (Normal 2.8-8.8 L/min) Indexed volumes: RV EDV: 36mL/sq-m (Normal 55-105 mL/sq-m) RV ESV: 23mL/sq-m (Normal 15-43 mL/sq-m) RV SV: 36mL/sq-m (Normal 32-64 mL/sq-m) CI: 4.2L/min/sq-m (Normal 1.7-4.2 L/min/sq-m) Left atrium: Normal size Right atrium: Normal size Mitral valve: Mild regurgitation Aortic valve: Trivial regurgitation Tricuspid valve: Mild regurgitation Pulmonic valve: No regurgitation Aorta: Normal proximal ascending aorta Pericardium: Normal IMPRESSION: 1.  No ischemia on stress perfusion imaging 2.  No late gadolinium enhancement to suggest myocardial scar 3. Normal LV size, no hypertrophy, and low normal systolic function (EF 52%) 4.  Normal RV size with mild systolic dysfunction (EF 48%) Electronically Signed   By: Lonni Nanas M.D.   On: 01/06/2025 18:06   MR CARDIAC VELOCITY FLOW MAP Result Date: 01/06/2025 CLINICAL DATA:  Stress CMR to evaluate for ischemia. 62M presented with syncope. LVEF 45-50% on echo EXAM: CARDIAC MRI PROCEDURE: PROCEDURE The patient  was scanned on a 1.5 Tesla Siemens magnet. A dedicated cardiac coil was used. Functional imaging was done using Fiesta sequences. 2,3, and 4 chamber views were done to assess for RWMA's. Modified Simpson's rule using a short axis stack was used to calculate an ejection fraction on a dedicated work [employer]. Rest and stress (following administration of regadenoson  0.4mg ) first-pass contrast-enhanced imaging was done. The patient received 9 cc of Gadavist . After 10 minutes inversion recovery sequences were used to assess for infiltration and scar tissue. Phase contrast velocity mapping was performed above the aortic and pulmonic valves CONTRAST:  9 cc  of Gadavist  FINDINGS: Left ventricle: -No hypertrophy -Normal size -Low normal systolic function -Elevated ECV (36%), though question accuracy with significant motion artifact on T1 mapping sequences -Normal T2 values -No ischemia on stress perfusion imaging -No LGE LV EF: 52% (Normal 56-78%) Absolute volumes: LV EDV: (Normal 77-195 mL) LV ESV: 66mL (Normal 19-72 mL) LV SV: 70mL (Normal 51-133 mL) CO: 6.8L/min (Normal 2.8-8.8 L/min) Indexed volumes: LV EDV: 75mL/sq-m (Normal 47-92 mL/sq-m) LV ESV: 25mL/sq-m (Normal 13-30 mL/sq-m) LV SV: 31mL/sq-m (Normal 32-62 mL/sq-m) CI: 3.8L/min/sq-m (Normal 1.7-4.2 L/min/sq-m) Right ventricle: Normal size with mild systolic dysfunction RV EF:  48% (Normal 47-74%) Absolute volumes: RV EDV: (Normal 88-227 mL) RV ESV: 85mL (Normal 23-103 mL) RV SV: 77mL (Normal 52-138 mL) CO: 7.4L/min (Normal 2.8-8.8 L/min) Indexed volumes: RV EDV: 3mL/sq-m (Normal 55-105 mL/sq-m) RV ESV: 76mL/sq-m (Normal 15-43 mL/sq-m) RV SV: 2mL/sq-m (Normal 32-64 mL/sq-m) CI: 4.2L/min/sq-m (Normal 1.7-4.2 L/min/sq-m) Left atrium: Normal size Right atrium: Normal size Mitral valve: Mild regurgitation Aortic valve: Trivial regurgitation Tricuspid valve: Mild regurgitation Pulmonic valve: No regurgitation Aorta: Normal proximal  ascending aorta Pericardium: Normal IMPRESSION: 1.  No ischemia on stress perfusion imaging 2.  No late gadolinium enhancement to suggest myocardial scar 3. Normal LV size, no hypertrophy, and low normal systolic function (EF 52%) 4.  Normal RV size with mild systolic dysfunction (EF 48%) Electronically Signed   By: Lonni Nanas M.D.   On: 01/06/2025 18:06   MR CARDIAC VELOCITY FLOW MAP Result  Date: 01/06/2025 CLINICAL DATA:  Stress CMR to evaluate for ischemia. 63M presented with syncope. LVEF 45-50% on echo EXAM: CARDIAC MRI PROCEDURE: PROCEDURE The patient was scanned on a 1.5 Tesla Siemens magnet. A dedicated cardiac coil was used. Functional imaging was done using Fiesta sequences. 2,3, and 4 chamber views were done to assess for RWMA's. Modified Simpson's rule using a short axis stack was used to calculate an ejection fraction on a dedicated work [employer], [employer]. Rest and stress (following administration of regadenoson  0.4mg ) first-pass contrast-enhanced imaging was done. The patient received 9 cc of Gadavist . After 10 minutes inversion recovery sequences were used to assess for infiltration and scar tissue. Phase contrast velocity mapping was performed above the aortic and pulmonic valves CONTRAST:  9 cc  of Gadavist  FINDINGS: Left ventricle: -No hypertrophy -Normal size -Low normal systolic function -Elevated ECV (36%), though question accuracy with significant motion artifact on T1 mapping sequences -Normal T2 values -No ischemia on stress perfusion imaging -No LGE LV EF: 52% (Normal 56-78%) Absolute volumes: LV EDV: (Normal 77-195 mL) LV ESV: 66mL (Normal 19-72 mL) LV SV: 70mL (Normal 51-133 mL) CO: 6.8L/min (Normal 2.8-8.8 L/min) Indexed volumes: LV EDV: 62mL/sq-m (Normal 47-92 mL/sq-m) LV ESV: 13mL/sq-m (Normal 13-30 mL/sq-m) LV SV: 73mL/sq-m (Normal 32-62 mL/sq-m) CI: 3.8L/min/sq-m (Normal 1.7-4.2 L/min/sq-m) Right ventricle: Normal size with mild systolic dysfunction RV  EF:  51% (Normal 47-74%) Absolute volumes: RV EDV: (Normal 88-227 mL) RV ESV: 85mL (Normal 23-103 mL) RV SV: 77mL (Normal 52-138 mL) CO: 7.4L/min (Normal 2.8-8.8 L/min) Indexed volumes: RV EDV: 69mL/sq-m (Normal 55-105 mL/sq-m) RV ESV: 83mL/sq-m (Normal 15-43 mL/sq-m) RV SV: 16mL/sq-m (Normal 32-64 mL/sq-m) CI: 4.2L/min/sq-m (Normal 1.7-4.2 L/min/sq-m) Left atrium: Normal size Right atrium: Normal size Mitral valve: Mild regurgitation Aortic valve: Trivial regurgitation Tricuspid valve: Mild regurgitation Pulmonic valve: No regurgitation Aorta: Normal proximal ascending aorta Pericardium: Normal IMPRESSION: 1.  No ischemia on stress perfusion imaging 2.  No late gadolinium enhancement to suggest myocardial scar 3. Normal LV size, no hypertrophy, and low normal systolic function (EF 52%) 4.  Normal RV size with mild systolic dysfunction (EF 48%) Electronically Signed   By: Lonni Nanas M.D.   On: 01/06/2025 18:06   MR BRAIN WO CONTRAST Result Date: 01/05/2025 EXAM: MRI Brain Without Contrast 01/05/2025 04:42:02 PM TECHNIQUE: Multiplanar multisequence MRI of the head/brain was performed without the administration of intravenous contrast. COMPARISON: CT head 01/04/25. CLINICAL HISTORY: Syncope/presyncope, cerebrovascular cause suspected FINDINGS: BRAIN AND VENTRICLES: No acute infarct. No intracranial hemorrhage. No mass. No midline shift. No hydrocephalus. The sella is unremarkable. Normal flow voids. ORBITS: No significant abnormality. SINUSES AND MASTOIDS: No significant abnormality. BONES AND SOFT TISSUES: Normal marrow signal. No soft tissue abnormality. IMPRESSION: 1. No acute abnormality. Electronically signed by: Glendia Molt MD 01/05/2025 05:07 PM EST RP Workstation: [id number]   EEG adult Result Date: 01/05/2025 Shelton Arlin KIDD, MD     01/05/2025  3:16 PM Patient Name: Damon Olson MRN: [id number] Epilepsy Attending: Arlin KIDD Shelton Referring Physician/Provider: Madelyne Owen LABOR, MD  Date: 01/05/2025 Duration: 26.03 mins Patient history: 49yo M with transient LOC. EEG to evaluate for seizure Level of alertness: Awake, asleep AEDs during EEG study: Ativan  Technical aspects: This EEG study was done with scalp electrodes positioned according to the 10-20 International system of electrode placement. Electrical activity was reviewed with band pass filter of 1-70Hz , sensitivity of 7 uV/mm, display speed of 24mm/sec with a 60Hz  notched filter applied as appropriate. EEG data were recorded continuously  and digitally stored.  Video monitoring was available and reviewed as appropriate. Description: The posterior dominant rhythm consists of 9 Hz activity of moderate voltage (25-35 uV) seen predominantly in posterior head regions, symmetric and reactive to eye opening and eye closing. Sleep was characterized by vertex waves, sleep spindles (12 to 14 Hz), maximal frontocentral region. Hyperventilation and photic stimulation were not performed.   IMPRESSION: This study is within normal limits. No seizures or epileptiform discharges were seen throughout the recording. A normal interictal EEG does not exclude the diagnosis of epilepsy. Priyanka O Yadav        Scheduled Meds:  doxycycline   100 mg Oral Q12H   enoxaparin  (LOVENOX ) injection  40 mg Subcutaneous Q24H   folic acid   1 mg Oral Daily   LORazepam   0-4 mg Intravenous Q8H   losartan   25 mg Oral Daily   metoprolol  succinate  25 mg Oral Daily   multivitamin with minerals  1 tablet Oral Daily   pantoprazole   40 mg Oral BID   potassium chloride   40 mEq Oral Q4H   saccharomyces boulardii  250 mg Oral BID   sodium chloride  flush  3 mL Intravenous Q12H   sucralfate   1 g Oral TID WC & HS   thiamine   100 mg Oral Daily   Or   thiamine   100 mg Intravenous Daily   Continuous Infusions:  cefTRIAXone  (ROCEPHIN )  IV 2 g (01/07/25 0917)   magnesium  sulfate bolus IVPB       LOS: 2 days    Time spent: 35 Minutes    Ramondo Dietze A Kaiyon Hynes,  MD [facility]   If 7PM-7AM, please contact night-coverage [email or web address]  01/07/2025, 1:55 PM   "

## 2025-01-08 ENCOUNTER — Other Ambulatory Visit: Payer: Self-pay | Admitting: Physician Assistant

## 2025-01-08 DIAGNOSIS — R55 Syncope and collapse: Secondary | ICD-10-CM

## 2025-01-08 LAB — CBC
HCT: 27.8 % — ABNORMAL LOW (ref 39.0–52.0)
Hemoglobin: 9.6 g/dL — ABNORMAL LOW (ref 13.0–17.0)
MCH: 35.4 pg — ABNORMAL HIGH (ref 26.0–34.0)
MCHC: 34.5 g/dL (ref 30.0–36.0)
MCV: 102.6 fL — ABNORMAL HIGH (ref 80.0–100.0)
Platelets: 477 10*3/uL — ABNORMAL HIGH (ref 150–400)
RBC: 2.71 MIL/uL — ABNORMAL LOW (ref 4.22–5.81)
RDW: 17.2 % — ABNORMAL HIGH (ref 11.5–15.5)
WBC: 5.8 10*3/uL (ref 4.0–10.5)
nRBC: 0.3 % — ABNORMAL HIGH (ref 0.0–0.2)

## 2025-01-08 LAB — LIPASE, BLOOD: Lipase: 114 U/L — ABNORMAL HIGH (ref 11–51)

## 2025-01-08 LAB — BASIC METABOLIC PANEL WITH GFR
Anion gap: 7 (ref 5–15)
BUN: 6 mg/dL (ref 6–20)
CO2: 28 mmol/L (ref 22–32)
Calcium: 8.8 mg/dL — ABNORMAL LOW (ref 8.9–10.3)
Chloride: 105 mmol/L (ref 98–111)
Creatinine, Ser: 0.74 mg/dL (ref 0.61–1.24)
GFR, Estimated: >60 mL/min
Glucose, Bld: 106 mg/dL — ABNORMAL HIGH (ref 70–99)
Potassium: 4 mmol/L (ref 3.5–5.1)
Sodium: 140 mmol/L (ref 135–145)

## 2025-01-08 LAB — GLUCOSE, CAPILLARY: Glucose-Capillary: 120 mg/dL — ABNORMAL HIGH (ref 70–99)

## 2025-01-08 LAB — MAGNESIUM: Magnesium: 1.9 mg/dL (ref 1.7–2.4)

## 2025-01-08 LAB — PHOSPHORUS: Phosphorus: 1.3 mg/dL — ABNORMAL LOW (ref 2.5–4.6)

## 2025-01-08 MED ORDER — POTASSIUM PHOSPHATES 15 MMOLE/5ML IV SOLN
30.0000 mmol | Freq: Once | INTRAVENOUS | Status: AC
  Administered 2025-01-08: 30 mmol via INTRAVENOUS
  Filled 2025-01-08: qty 10

## 2025-01-08 MED ORDER — OXYCODONE HCL 5 MG PO TABS
5.0000 mg | ORAL_TABLET | Freq: Four times a day (QID) | ORAL | Status: DC | PRN
  Administered 2025-01-08: 5 mg via ORAL
  Filled 2025-01-08: qty 1

## 2025-01-08 NOTE — Progress Notes (Signed)
"  °  Progress Note  Patient Name: Damon Olson Date of Encounter: 01/08/2025 [facility] Cardiologist: Georganna Archer, MD   Interval Summary   Feels well  Vital Signs Vitals:   01/07/25 2321 01/08/25 0348 01/08/25 0500 01/08/25 0919  BP: (!) 118/91 128/89  (!) 127/100  Pulse: 86 80  88  Resp: 18 18  18   Temp: 98.2 F (36.8 C) 98.2 F (36.8 C)  98.4 F (36.9 C)  TempSrc: Oral Oral  Oral  SpO2: 100% 100%  100%  Weight:   58.7 kg   Height:        Intake/Output Summary (Last 24 hours) at 01/08/2025 1029 Last data filed at 01/08/2025 0400 Gross per 24 hour  Intake 550.43 ml  Output 2800 ml  Net -2249.57 ml      01/08/2025    5:00 AM 01/07/2025    5:00 AM 01/06/2025    5:00 AM  Last 3 Weights  Weight (lbs) 129 lb 6.6 oz 135 lb 12.9 oz 135 lb 12.9 oz  Weight (kg) 58.7 kg 61.6 kg 61.6 kg      Telemetry/ECG  NSR, a single 4-beat NSVT, asymptomatic - Personally Reviewed  Physical Exam  GEN: No acute distress.   Neck: No JVD Cardiac: RRR, no murmurs, rubs, or gallops.  Respiratory: Clear to auscultation bilaterally. GI: Soft, nontender, non-distended  MS: No edema  Assessment & Plan  49 yo with an episode of loss of consciousness, unclear mechanism, found to have minimally reduced LVEF on MRI (52%), but without evidence of scar, ischemia, excessive hypertrophy or any other high risk features. Echo described regional septal; motion abnormality, but this was not confirmed on MRI. 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 Started on metoprolol  succ and losartan , so far tolerating those without side effects. No overt HF symptoms or signs, so SGLT2inh and spironolactone not initiated yet. Does not require loop diuretics. Recommend re-valuation of LV function after 3 months of alcohol abstinence. QTc was mildly prolonged at 484 ms on 01/23, when he was markedly hypokalemic, butappears normal on telemetry now. Improving lipase. Persistent hypophosphatemia. Anemia,  leukopenia and high MCV all consistent with probable malnutrition. BMI 17. [facility] will sign off.   The patient is ready for discharge today from a cardiac standpoint. Medication Recommendations:   Losartan  25 mg daily Metoprolol  succinate 25 mg daily. Other recommendations (labs, testing, etc):  recheck echo in 3 months. 14 day monitor at DC - will arrange, please let us  know DC plans. Follow up as an outpatient:  will schedule in a few weeks. For questions or updates, please contact [facility] Please consult [email or web address] for contact info under         Signed, Jerel Balding, MD   "

## 2025-01-08 NOTE — Plan of Care (Signed)
" °  Problem: Health Behavior/Discharge Planning: Goal: Ability to manage health-related needs will improve Outcome: Progressing   Problem: Clinical Measurements: Goal: Will remain free from infection Outcome: Progressing   Problem: Clinical Measurements: Goal: Cardiovascular complication will be avoided Outcome: Progressing   Problem: Activity: Goal: Risk for activity intolerance will decrease Outcome: Progressing   Problem: Nutrition: Goal: Adequate nutrition will be maintained Outcome: Progressing   Problem: Coping: Goal: Level of anxiety will decrease Outcome: Progressing   Problem: Elimination: Goal: Will not experience complications related to bowel motility Outcome: Progressing   Problem: Pain Managment: Goal: General experience of comfort will improve and/or be controlled Outcome: Progressing   Problem: Safety: Goal: Ability to remain free from injury will improve Outcome: Progressing   "

## 2025-01-08 NOTE — Progress Notes (Signed)
 See Dr Croitoru's note 01/25.  Shona Shad, PA-C 01/08/2025 11:36 AM

## 2025-01-08 NOTE — Progress Notes (Signed)
" ° °  Zio live telemetry monitor ordered and message sent for him to get this put on and get a f/u appt in 4-5 weeks.  Shona Shad, PA-C 01/08/2025 11:39 AM  "

## 2025-01-08 NOTE — Progress Notes (Signed)
 " PROGRESS NOTE    Damon Olson  FMW:[id number] DOB: 1976-10-22 DOA: 01/04/2025 PCP: Pcp, No   Brief Narrative: 49 year old with past medical history significant for depression, anxiety, alcohol abuse presented after loss of consciousness.  Patient reports a history of nausea vomiting lower abdominal pain on 1/17 symptoms resolved by 1/19.  He normally drinks a sixpack of beer every night after work, but has not had any alcohol in 3 days.  Denies any history of seizures.  Per EMS report patient was incontinent in urine.  Evaluation in the ED lactic acid 2.5, white blood cell 12, creatinine 1.5, magnesium  1.6 potassium 2.6.  CT head and cervical spine no acute findings.  Chest x-ray bibasilar airspace opacity concerning for pneumonia.  Assessment & Plan:   Principal Problem:   Syncope and collapse Active Problems:   Unspecified mood (affective) disorder   Hypokalemia   Hypomagnesemia   Pneumonia   Prolonged QT interval  1-Syncope versus Seizure - Presented after brief loss of consciousness, had urinary incontinence per EMS. -suspect Syncope in setting hypotension and severe hypokalemia.  -FU ECHO: abnormal ECHO EF 45--50 %, regional wall motion abnormalities. Will consult cardiology  -Treated  with IV fluids. Replete electrolytes.  MRI: negative for Stroke.  EEG negative for seizure.    Pneumonia: - He reported productive cough, SIRS criteria, chest x-ray bibasilar infiltrates, leukocytosis white blood cell 12. -COVID, RSV, influenza negative. -Blood cultures no growth to date. -He will complete 5 days of IV antibiotics today.   Alcohol abuse: - Monitor on CIWA  Cardiomyopathy;  EF 45 % Suspect ETOH related.  Cardiac MRI ordered. Negative for reversible ischemia.  Appreciate cardiology evaluation.  Started on Cozaar , metoprolol .   Hypokalemia, hypomagnesemia, Hypophosphatemia.  - Received IV and oral supplement.  -replete mg and phosphorus.  -   Prolonged  QT: -Correct electrolyte  Weakness; generalized. FTT,   -B12 normal , TSH: 1.1, Thiamine  pending.  -nutrition consultation   Diarrhea abdominal pain. Mild pancreatitis.  GI pathogen. negative CT abdomen pelvis negative Lipase level. Elevated.  C diff negative.  Diarrhea improved.  May have some pancreatitis. Support care.   Gastritis, esophagitis; probably form Alcohol.  Continue PPI, Carafate .   Estimated body mass index is 17.55 kg/m as calculated from the following:   Height as of this encounter: 6' (1.829 m).   Weight as of this encounter: 58.7 kg.   DVT prophylaxis: Lovenox  Code Status: Full code Family Communication: care dicussed with patient.  Disposition Plan:  Status is: Inpatient Remains inpatient appropriate because: management of syncope.     Consultants:  Cardiology  Procedures:  ECHO  Antimicrobials:    Subjective: Report only one BM a day, still watery. Report abdominal pain is burning sensation , improving.   Objective: Vitals:   01/07/25 1940 01/07/25 2321 01/08/25 0348 01/08/25 0500  BP: (!) 135/98 (!) 118/91 128/89   Pulse: 78 86 80   Resp: 16 18 18    Temp: 97.7 F (36.5 C) 98.2 F (36.8 C) 98.2 F (36.8 C)   TempSrc: Oral Oral Oral   SpO2: 100% 100% 100%   Weight:    58.7 kg  Height:        Intake/Output Summary (Last 24 hours) at 01/08/2025 0641 Last data filed at 01/08/2025 0400 Gross per 24 hour  Intake 550.43 ml  Output 2800 ml  Net -2249.57 ml   Filed Weights   01/06/25 0500 01/07/25 0500 01/08/25 0500  Weight: 61.6 kg 61.6 kg 58.7 kg  Examination:  General exam: NAD Respiratory system: CTA CVS; S 1, S 2 RRR Gastrointestinal system: BS present. Soft , nt Central nervous system: Alert Extremities: no edema  Data Reviewed: I have personally reviewed following labs and imaging studies  CBC: Recent Labs  Lab 01/04/25 2207 01/04/25 2223 01/05/25 0525 01/06/25 0715  WBC 12.4*  --  10.7* 8.3  NEUTROABS  10.4*  --   --   --   HGB 11.3* 11.9* 10.2* 9.3*  HCT 30.3* 35.0* 27.8* 25.8*  MCV 95.6  --  97.9 98.1  PLT 389  --  357 358   Basic Metabolic Panel: Recent Labs  Lab 01/04/25 2207 01/04/25 2223 01/05/25 0525 01/06/25 0715 01/06/25 1441 01/07/25 0831 01/07/25 1825  NA 132* 132* 134* 136 137 138  --   K 2.6* 2.5* 3.0* 2.5* 3.2* 3.2*  --   CL 91* 95* 96* 102 102 104  --   CO2 23  --  21* 23 25 24   --   GLUCOSE 181* 188* 138* 121* 160* 139*  --   BUN 45* 50* 35* 17 13 9   --   CREATININE 1.45* 1.40* 1.00 0.81 0.76 0.68  --   CALCIUM 9.7  --  9.1 8.9 8.9 8.8*  --   MG 1.6*  --  2.2 1.6*  --  1.3*  --   PHOS  --   --  2.5 1.1*  --   --  1.3*   GFR: Estimated Creatinine Clearance: 94.8 mL/min (by C-G formula based on SCr of 0.68 mg/dL). Liver Function Tests: Recent Labs  Lab 01/04/25 2207 01/05/25 0525  AST 74* 59*  ALT 36 30  ALKPHOS 130* 134*  BILITOT 0.7 0.5  PROT 7.3 6.5  ALBUMIN 3.9 3.5   Recent Labs  Lab 01/07/25 1825  LIPASE 135*   No results for input(s): AMMONIA in the last 168 hours. Coagulation Profile: Recent Labs  Lab 01/04/25 2252  INR 1.0   Cardiac Enzymes: No results for input(s): CKTOTAL, CKMB, CKMBINDEX, TROPONINI in the last 168 hours. BNP (last 3 results) No results for input(s): PROBNP in the last 8760 hours. HbA1C: No results for input(s): HGBA1C in the last 72 hours. CBG: Recent Labs  Lab 01/04/25 2240 01/05/25 0536 01/06/25 0759 01/07/25 0537 01/08/25 0605  GLUCAP 179* 134* 143* 119* 120*   Lipid Profile: No results for input(s): CHOL, HDL, LDLCALC, TRIG, CHOLHDL, LDLDIRECT in the last 72 hours. Thyroid Function Tests: Recent Labs    01/05/25 1930  TSH 1.130   Anemia Panel: Recent Labs    01/05/25 1930  VITAMINB12 692  FOLATE 16.8   Sepsis Labs: Recent Labs  Lab 01/04/25 2220 01/05/25 0026  LATICACIDVEN 2.5* 0.7    Recent Results (from the past 240 hours)  Resp panel by RT-PCR (RSV,  Flu A&B, Covid) Urine, Clean Catch     Status: None   Collection Time: 01/04/25 10:17 PM   Specimen: Urine, Clean Catch; Nasal Swab  Result Value Ref Range Status   SARS Coronavirus 2 by RT PCR NEGATIVE NEGATIVE Final   Influenza A by PCR NEGATIVE NEGATIVE Final   Influenza B by PCR NEGATIVE NEGATIVE Final    Comment: (NOTE) The Xpert Xpress SARS-CoV-2/FLU/RSV plus assay is intended as an aid in the diagnosis of influenza from Nasopharyngeal swab specimens and should not be used as a sole basis for treatment. Nasal washings and aspirates are unacceptable for Xpert Xpress SARS-CoV-2/FLU/RSV testing.  Fact Sheet for Patients: bloggercourse.com  Fact Sheet  for Healthcare Providers: seriousbroker.it  This test is not yet approved or cleared by the [place]  FDA and has been authorized for detection and/or diagnosis of SARS-CoV-2 by FDA under an Emergency Use Authorization (EUA). This EUA will remain in effect (meaning this test can be used) for the duration of the COVID-19 declaration under Section 564(b)(1) of the Act, 21 U.S.C. section 360bbb-3(b)(1), unless the authorization is terminated or revoked.     Resp Syncytial Virus by PCR NEGATIVE NEGATIVE Final    Comment: (NOTE) Fact Sheet for Patients: bloggercourse.com  Fact Sheet for Healthcare Providers: seriousbroker.it  This test is not yet approved or cleared by the [place]  FDA and has been authorized for detection and/or diagnosis of SARS-CoV-2 by FDA under an Emergency Use Authorization (EUA). This EUA will remain in effect (meaning this test can be used) for the duration of the COVID-19 declaration under Section 564(b)(1) of the Act, 21 U.S.C. section 360bbb-3(b)(1), unless the authorization is terminated or revoked.  Performed at [facility], [address], [place], [place] [address]   Culture, blood  (routine x 2)     Status: None (Preliminary result)   Collection Time: 01/04/25 10:52 PM   Specimen: BLOOD LEFT FOREARM  Result Value Ref Range Status   Specimen Description BLOOD LEFT FOREARM  Final   Special Requests   Final    BOTTLES DRAWN AEROBIC AND ANAEROBIC Blood Culture results may not be optimal due to an inadequate volume of blood received in culture bottles   Culture   Final    NO GROWTH 3 DAYS Performed at [facility], [address] [place], [place] [address]    Report Status PENDING  Incomplete  Culture, blood (routine x 2)     Status: None (Preliminary result)   Collection Time: 01/04/25 10:55 PM   Specimen: BLOOD  Result Value Ref Range Status   Specimen Description BLOOD SITE NOT SPECIFIED  Final   Special Requests   Final    BOTTLES DRAWN AEROBIC AND ANAEROBIC Blood Culture adequate volume   Culture   Final    NO GROWTH 3 DAYS Performed at [facility], [address], [place], [place] [address]    Report Status PENDING  Incomplete  C Difficile Quick Screen w PCR reflex     Status: None   Collection Time: 01/06/25 12:35 PM   Specimen: STOOL  Result Value Ref Range Status   C Diff antigen NEGATIVE NEGATIVE Final   C Diff toxin NEGATIVE NEGATIVE Final   C Diff interpretation No C. difficile detected.  Final    Comment: Performed at [facility], [address] [place], [place] [address]  Gastrointestinal Panel by PCR , Stool     Status: None   Collection Time: 01/06/25  2:58 PM   Specimen: Stool  Result Value Ref Range Status   Campylobacter species NOT DETECTED NOT DETECTED Final   Plesimonas shigelloides NOT DETECTED NOT DETECTED Final   Salmonella species NOT DETECTED NOT DETECTED Final   Yersinia enterocolitica NOT DETECTED NOT DETECTED Final   Vibrio species NOT DETECTED NOT DETECTED Final   Vibrio cholerae NOT DETECTED NOT DETECTED Final   Enteroaggregative E coli (EAEC) NOT DETECTED NOT DETECTED Final   Enteropathogenic E coli  (EPEC) NOT DETECTED NOT DETECTED Final   Enterotoxigenic E coli (ETEC) NOT DETECTED NOT DETECTED Final   Shiga like toxin producing E coli (STEC) NOT DETECTED NOT DETECTED Final   Shigella/Enteroinvasive E  coli (EIEC) NOT DETECTED NOT DETECTED Final   Cryptosporidium NOT DETECTED NOT DETECTED Final   Cyclospora cayetanensis NOT DETECTED NOT DETECTED Final   Entamoeba histolytica NOT DETECTED NOT DETECTED Final   Giardia lamblia NOT DETECTED NOT DETECTED Final   Adenovirus F40/41 NOT DETECTED NOT DETECTED Final   Astrovirus NOT DETECTED NOT DETECTED Final   Norovirus GI/GII NOT DETECTED NOT DETECTED Final   Rotavirus A NOT DETECTED NOT DETECTED Final   Sapovirus (I, II, IV, and V) NOT DETECTED NOT DETECTED Final    Comment: Performed at [facility], [address], [place], [place] [address]         Radiology Studies: CT ABDOMEN PELVIS WO CONTRAST Result Date: 01/08/2025 CLINICAL DATA:  Abdominal pain. EXAM: CT ABDOMEN AND PELVIS WITHOUT CONTRAST TECHNIQUE: Multidetector CT imaging of the abdomen and pelvis was performed following the standard protocol without IV contrast. RADIATION DOSE REDUCTION: This exam was performed according to the departmental dose-optimization program which includes automated exposure control, adjustment of the mA and/or kV according to patient size and/or use of iterative reconstruction technique. COMPARISON:  None Available. FINDINGS: Lower chest: Patchy opacity in the lung bases likely atelectatic although infectious/inflammatory etiology is not entirely excluded. Hepatobiliary: The liver shows diffusely decreased attenuation suggesting fat deposition. No suspicious focal abnormality in the liver on this study without intravenous contrast. There is no evidence for gallstones, gallbladder wall thickening, or pericholecystic fluid. No intrahepatic or extrahepatic biliary dilation. Pancreas: No focal mass lesion. No dilatation of the main duct. No  intraparenchymal cyst. No peripancreatic edema. Spleen: No splenomegaly. No suspicious focal mass lesion. Adrenals/Urinary Tract: No adrenal nodule or mass. 6 mm subcapsular hyperattenuating lesion in the lower interpolar right kidney cannot be definitively characterized but is likely a proteinaceous or hemorrhagic cyst No followup imaging is recommended. left kidney unremarkable. No evidence for hydroureter. The urinary bladder appears normal for the degree of distention. Stomach/Bowel: Stomach is unremarkable. No gastric wall thickening. No evidence of outlet obstruction. Duodenum is normally positioned as is the ligament of Treitz. No small bowel wall thickening. No small bowel dilatation. The terminal ileum is normal. The appendix is normal. No gross colonic mass. No colonic wall thickening. Vascular/Lymphatic: There is moderate atherosclerotic calcification of the abdominal aorta without aneurysm. There is no gastrohepatic or hepatoduodenal ligament lymphadenopathy. No retroperitoneal or mesenteric lymphadenopathy. No pelvic sidewall lymphadenopathy. Reproductive: The prostate gland and seminal vesicles are unremarkable. Other: No substantial intraperitoneal free fluid. Musculoskeletal: No worrisome lytic or sclerotic osseous abnormality. IMPRESSION: 1. No acute findings in the abdomen or pelvis. Specifically, no findings to explain the patient's history of abdominal pain. 2. Hepatic steatosis. 3. Patchy opacity in the lung bases likely atelectatic although infectious/inflammatory etiology is not entirely excluded. 4.  Aortic Atherosclerosis (ICD10-I70.0). Electronically Signed   By: Camellia Candle M.D.   On: 01/08/2025 05:18   MR CARDIAC STRESS TEST Result Date: 01/06/2025 CLINICAL DATA:  Stress CMR to evaluate for ischemia. 7M presented with syncope. LVEF 45-50% on echo EXAM: CARDIAC MRI PROCEDURE: PROCEDURE The patient was scanned on a 1.5 Tesla Siemens magnet. A dedicated cardiac coil was used.  Functional imaging was done using Fiesta sequences. 2,3, and 4 chamber views were done to assess for RWMA's. Modified Simpson's rule using a short axis stack was used to calculate an ejection fraction on a dedicated work [employer]. Rest and stress (following administration of regadenoson  0.4mg ) first-pass contrast-enhanced imaging was done. The patient received 9 cc of Gadavist . After 10  minutes inversion recovery sequences were used to assess for infiltration and scar tissue. Phase contrast velocity mapping was performed above the aortic and pulmonic valves CONTRAST:  9 cc  of Gadavist  FINDINGS: Left ventricle: -No hypertrophy -Normal size -Low normal systolic function -Elevated ECV (36%), though question accuracy with significant motion artifact on T1 mapping sequences -Normal T2 values -No ischemia on stress perfusion imaging -No LGE LV EF: 52% (Normal 56-78%) Absolute volumes: LV EDV: (Normal 77-195 mL) LV ESV: 66mL (Normal 19-72 mL) LV SV: 70mL (Normal 51-133 mL) CO: 6.8L/min (Normal 2.8-8.8 L/min) Indexed volumes: LV EDV: 3mL/sq-m (Normal 47-92 mL/sq-m) LV ESV: 69mL/sq-m (Normal 13-30 mL/sq-m) LV SV: 37mL/sq-m (Normal 32-62 mL/sq-m) CI: 3.8L/min/sq-m (Normal 1.7-4.2 L/min/sq-m) Right ventricle: Normal size with mild systolic dysfunction RV EF:  48% (Normal 47-74%) Absolute volumes: RV EDV: (Normal 88-227 mL) RV ESV: 85mL (Normal 23-103 mL) RV SV: 77mL (Normal 52-138 mL) CO: 7.4L/min (Normal 2.8-8.8 L/min) Indexed volumes: RV EDV: 28mL/sq-m (Normal 55-105 mL/sq-m) RV ESV: 71mL/sq-m (Normal 15-43 mL/sq-m) RV SV: 39mL/sq-m (Normal 32-64 mL/sq-m) CI: 4.2L/min/sq-m (Normal 1.7-4.2 L/min/sq-m) Left atrium: Normal size Right atrium: Normal size Mitral valve: Mild regurgitation Aortic valve: Trivial regurgitation Tricuspid valve: Mild regurgitation Pulmonic valve: No regurgitation Aorta: Normal proximal ascending aorta Pericardium: Normal IMPRESSION: 1.  No ischemia on stress perfusion  imaging 2.  No late gadolinium enhancement to suggest myocardial scar 3. Normal LV size, no hypertrophy, and low normal systolic function (EF 52%) 4.  Normal RV size with mild systolic dysfunction (EF 48%) Electronically Signed   By: Lonni Nanas M.D.   On: 01/06/2025 18:06   MR CARDIAC VELOCITY FLOW MAP Result Date: 01/06/2025 CLINICAL DATA:  Stress CMR to evaluate for ischemia. 7M presented with syncope. LVEF 45-50% on echo EXAM: CARDIAC MRI PROCEDURE: PROCEDURE The patient was scanned on a 1.5 Tesla Siemens magnet. A dedicated cardiac coil was used. Functional imaging was done using Fiesta sequences. 2,3, and 4 chamber views were done to assess for RWMA's. Modified Simpson's rule using a short axis stack was used to calculate an ejection fraction on a dedicated work [employer]. Rest and stress (following administration of regadenoson  0.4mg ) first-pass contrast-enhanced imaging was done. The patient received 9 cc of Gadavist . After 10 minutes inversion recovery sequences were used to assess for infiltration and scar tissue. Phase contrast velocity mapping was performed above the aortic and pulmonic valves CONTRAST:  9 cc  of Gadavist  FINDINGS: Left ventricle: -No hypertrophy -Normal size -Low normal systolic function -Elevated ECV (36%), though question accuracy with significant motion artifact on T1 mapping sequences -Normal T2 values -No ischemia on stress perfusion imaging -No LGE LV EF: 52% (Normal 56-78%) Absolute volumes: LV EDV: (Normal 77-195 mL) LV ESV: 66mL (Normal 19-72 mL) LV SV: 70mL (Normal 51-133 mL) CO: 6.8L/min (Normal 2.8-8.8 L/min) Indexed volumes: LV EDV: 5mL/sq-m (Normal 47-92 mL/sq-m) LV ESV: 30mL/sq-m (Normal 13-30 mL/sq-m) LV SV: 57mL/sq-m (Normal 32-62 mL/sq-m) CI: 3.8L/min/sq-m (Normal 1.7-4.2 L/min/sq-m) Right ventricle: Normal size with mild systolic dysfunction RV EF:  48% (Normal 47-74%) Absolute volumes: RV EDV: (Normal 88-227 mL) RV ESV:  85mL (Normal 23-103 mL) RV SV: 77mL (Normal 52-138 mL) CO: 7.4L/min (Normal 2.8-8.8 L/min) Indexed volumes: RV EDV: 67mL/sq-m (Normal 55-105 mL/sq-m) RV ESV: 70mL/sq-m (Normal 15-43 mL/sq-m) RV SV: 79mL/sq-m (Normal 32-64 mL/sq-m) CI: 4.2L/min/sq-m (Normal 1.7-4.2 L/min/sq-m) Left atrium: Normal size Right atrium: Normal size Mitral valve: Mild regurgitation Aortic valve: Trivial regurgitation Tricuspid valve: Mild regurgitation Pulmonic valve: No regurgitation Aorta:  Normal proximal ascending aorta Pericardium: Normal IMPRESSION: 1.  No ischemia on stress perfusion imaging 2.  No late gadolinium enhancement to suggest myocardial scar 3. Normal LV size, no hypertrophy, and low normal systolic function (EF 52%) 4.  Normal RV size with mild systolic dysfunction (EF 48%) Electronically Signed   By: Lonni Nanas M.D.   On: 01/06/2025 18:06   MR CARDIAC VELOCITY FLOW MAP Result Date: 01/06/2025 CLINICAL DATA:  Stress CMR to evaluate for ischemia. 54M presented with syncope. LVEF 45-50% on echo EXAM: CARDIAC MRI PROCEDURE: PROCEDURE The patient was scanned on a 1.5 Tesla Siemens magnet. A dedicated cardiac coil was used. Functional imaging was done using Fiesta sequences. 2,3, and 4 chamber views were done to assess for RWMA's. Modified Simpson's rule using a short axis stack was used to calculate an ejection fraction on a dedicated work [employer]. Rest and stress (following administration of regadenoson  0.4mg ) first-pass contrast-enhanced imaging was done. The patient received 9 cc of Gadavist . After 10 minutes inversion recovery sequences were used to assess for infiltration and scar tissue. Phase contrast velocity mapping was performed above the aortic and pulmonic valves CONTRAST:  9 cc  of Gadavist  FINDINGS: Left ventricle: -No hypertrophy -Normal size -Low normal systolic function -Elevated ECV (36%), though question accuracy with significant motion artifact on T1 mapping sequences  -Normal T2 values -No ischemia on stress perfusion imaging -No LGE LV EF: 52% (Normal 56-78%) Absolute volumes: LV EDV: (Normal 77-195 mL) LV ESV: 66mL (Normal 19-72 mL) LV SV: 70mL (Normal 51-133 mL) CO: 6.8L/min (Normal 2.8-8.8 L/min) Indexed volumes: LV EDV: 63mL/sq-m (Normal 47-92 mL/sq-m) LV ESV: 71mL/sq-m (Normal 13-30 mL/sq-m) LV SV: 13mL/sq-m (Normal 32-62 mL/sq-m) CI: 3.8L/min/sq-m (Normal 1.7-4.2 L/min/sq-m) Right ventricle: Normal size with mild systolic dysfunction RV EF:  48% (Normal 47-74%) Absolute volumes: RV EDV: (Normal 88-227 mL) RV ESV: 85mL (Normal 23-103 mL) RV SV: 77mL (Normal 52-138 mL) CO: 7.4L/min (Normal 2.8-8.8 L/min) Indexed volumes: RV EDV: 25mL/sq-m (Normal 55-105 mL/sq-m) RV ESV: 92mL/sq-m (Normal 15-43 mL/sq-m) RV SV: 71mL/sq-m (Normal 32-64 mL/sq-m) CI: 4.2L/min/sq-m (Normal 1.7-4.2 L/min/sq-m) Left atrium: Normal size Right atrium: Normal size Mitral valve: Mild regurgitation Aortic valve: Trivial regurgitation Tricuspid valve: Mild regurgitation Pulmonic valve: No regurgitation Aorta: Normal proximal ascending aorta Pericardium: Normal IMPRESSION: 1.  No ischemia on stress perfusion imaging 2.  No late gadolinium enhancement to suggest myocardial scar 3. Normal LV size, no hypertrophy, and low normal systolic function (EF 52%) 4.  Normal RV size with mild systolic dysfunction (EF 48%) Electronically Signed   By: Lonni Nanas M.D.   On: 01/06/2025 18:06        Scheduled Meds:  chlordiazePOXIDE   25 mg Oral BID   doxycycline   100 mg Oral Q12H   enoxaparin  (LOVENOX ) injection  40 mg Subcutaneous Q24H   folic acid   1 mg Oral Daily   LORazepam   0-4 mg Intravenous Q8H   losartan   25 mg Oral Daily   metoprolol  succinate  25 mg Oral Daily   multivitamin with minerals  1 tablet Oral Daily   pantoprazole   40 mg Oral BID   saccharomyces boulardii  250 mg Oral BID   sodium chloride  flush  3 mL Intravenous Q12H   sucralfate   1 g Oral TID WC & HS    thiamine   100 mg Oral Daily   Or   thiamine   100 mg Intravenous Daily   Continuous Infusions:  cefTRIAXone  (ROCEPHIN )  IV Stopped (01/07/25 0947)  potassium PHOSPHATE  IVPB (in mmol)       LOS: 3 days    Time spent: 35 Minutes    Sofiah Lyne A Amando Ishikawa, MD [facility]   If 7PM-7AM, please contact night-coverage [email or web address]  01/08/2025, 6:41 AM   "

## 2025-01-09 ENCOUNTER — Inpatient Hospital Stay (HOSPITAL_COMMUNITY): Payer: Self-pay

## 2025-01-09 LAB — BASIC METABOLIC PANEL WITH GFR
Anion gap: 9 (ref 5–15)
BUN: 8 mg/dL (ref 6–20)
CO2: 26 mmol/L (ref 22–32)
Calcium: 8.5 mg/dL — ABNORMAL LOW (ref 8.9–10.3)
Chloride: 104 mmol/L (ref 98–111)
Creatinine, Ser: 0.79 mg/dL (ref 0.61–1.24)
GFR, Estimated: >60 mL/min
Glucose, Bld: 130 mg/dL — ABNORMAL HIGH (ref 70–99)
Potassium: 4.1 mmol/L (ref 3.5–5.1)
Sodium: 138 mmol/L (ref 135–145)

## 2025-01-09 LAB — MAGNESIUM: Magnesium: 1.7 mg/dL (ref 1.7–2.4)

## 2025-01-09 LAB — CULTURE, BLOOD (ROUTINE X 2)
Culture: NO GROWTH
Culture: NO GROWTH
Special Requests: ADEQUATE

## 2025-01-09 LAB — PHOSPHORUS: Phosphorus: 3.4 mg/dL (ref 2.5–4.6)

## 2025-01-09 LAB — LIPASE, BLOOD: Lipase: 114 U/L — ABNORMAL HIGH (ref 11–51)

## 2025-01-09 MED ORDER — LACTATED RINGERS IV SOLN: INTRAVENOUS | Status: DC

## 2025-01-09 MED ORDER — MAGNESIUM SULFATE 2 GM/50ML IV SOLN
2.0000 g | Freq: Once | INTRAVENOUS | Status: AC
  Administered 2025-01-09: 2 g via INTRAVENOUS
  Filled 2025-01-09: qty 50

## 2025-01-09 MED ORDER — GABAPENTIN 100 MG PO CAPS
100.0000 mg | ORAL_CAPSULE | Freq: Three times a day (TID) | ORAL | Status: DC
  Administered 2025-01-09 – 2025-01-12 (×9): 100 mg via ORAL
  Filled 2025-01-09 (×9): qty 1

## 2025-01-09 MED ORDER — LIDOCAINE 5 % EX PTCH
1.0000 | MEDICATED_PATCH | CUTANEOUS | Status: DC
  Administered 2025-01-09 – 2025-01-12 (×4): 1 via TRANSDERMAL
  Filled 2025-01-09 (×4): qty 1

## 2025-01-09 MED ORDER — OXYCODONE HCL 5 MG PO TABS
5.0000 mg | ORAL_TABLET | Freq: Four times a day (QID) | ORAL | Status: DC | PRN
  Administered 2025-01-09 – 2025-01-12 (×6): 5 mg via ORAL
  Filled 2025-01-09 (×6): qty 1

## 2025-01-09 NOTE — Progress Notes (Addendum)
 " PROGRESS NOTE    Damon Olson  FMW:[id number] DOB: September 13, 1976 DOA: 01/04/2025 PCP: Pcp, No   Brief Narrative: 49 year old with past medical history significant for depression, anxiety, alcohol abuse presented after loss of consciousness.  Patient reports a history of nausea vomiting lower abdominal pain on 1/17 symptoms resolved by 1/19.  He normally drinks a sixpack of beer every night after work, but has not had any alcohol in 3 days.  Denies any history of seizures.  Per EMS report patient was incontinent in urine.  Evaluation in the ED lactic acid 2.5, white blood cell 12, creatinine 1.5, magnesium  1.6 potassium 2.6.  CT head and cervical spine no acute findings.  Chest x-ray bibasilar airspace opacity concerning for pneumonia.  Assessment & Plan:   Principal Problem:   Syncope and collapse Active Problems:   Unspecified mood (affective) disorder   Hypokalemia   Hypomagnesemia   Pneumonia   Prolonged QT interval  1-Syncope versus Seizure - Presented after brief loss of consciousness, had urinary incontinence per EMS. -suspect Syncope in setting hypotension and severe hypokalemia.  -FU ECHO: abnormal ECHO EF 45--50 %, regional wall motion abnormalities. Will consult cardiology  -Treated  with IV fluids. Replete electrolytes.  MRI: negative for Stroke.  EEG negative for seizure.   Mild pancreatitis.  Diarrhea abdominal pain.  GI pathogen. negative CT abdomen pelvis negative Lipase level mildly elevated.  C diff negative.  Diarrhea improved.  Vomited today, still having abdominal pain. Plan to make him NPO, start clears if feels better later.  Pain management.   Gastritis, esophagitis; probably form Alcohol.  Continue PPI, Carafate .   Pneumonia: - He reported productive cough, SIRS criteria, chest x-ray bibasilar infiltrates, leukocytosis white blood cell 12. -COVID, RSV, influenza negative. -Blood cultures no growth to date. -He will complete 5 days of IV  antibiotics   Alcohol abuse: - Monitor on CIWA  Cardiomyopathy;  EF 45 % Suspect ETOH related.  Cardiac MRI ordered. Negative for reversible ischemia.  Appreciate cardiology evaluation.  Started on Cozaar , metoprolol .   Hypokalemia, hypomagnesemia, Hypophosphatemia.  - Received IV and oral supplement.  -Replaced.    Prolonged QT: -Correct electrolyte  Weakness; generalized. FTT,   -B12 normal , TSH: 1.1, Thiamine  pending.  -nutrition consultation   Hip pain; will get x ray   Estimated body mass index is 17.91 kg/m as calculated from the following:   Height as of this encounter: 6' (1.829 m).   Weight as of this encounter: 59.9 kg.   DVT prophylaxis: Lovenox  Code Status: Full code Family Communication: care dicussed with patient.  Disposition Plan:  Status is: Inpatient Remains inpatient appropriate because: management of syncope.     Consultants:  Cardiology  Procedures:  ECHO  Antimicrobials:    Subjective: He vomited this morning, report worsening abdominal pain.  He has some left side ribs pain, mild edema, suspect related to small hematoma.  Only having one BM per day.,    Objective: Vitals:   01/08/25 2047 01/09/25 0020 01/09/25 0416 01/09/25 0500  BP: 111/78 116/88 102/79   Pulse: 80 96 88   Resp: 14 16 18    Temp: 98.5 F (36.9 C) 98.7 F (37.1 C) 98.4 F (36.9 C)   TempSrc: Oral Oral Oral   SpO2: 100% 100% 100%   Weight:    59.9 kg  Height:        Intake/Output Summary (Last 24 hours) at 01/09/2025 1245 Last data filed at 01/09/2025 0500 Gross per 24 hour  Intake  600 ml  Output --  Net 600 ml   Filed Weights   01/07/25 0500 01/08/25 0500 01/09/25 0500  Weight: 61.6 kg 58.7 kg 59.9 kg    Examination:  General exam: NAD Respiratory system: CTA CVS; S 1, S 2 RRR Gastrointestinal system: BS present, soft, mild tenderness.  Central nervous system: Alert Extremities: no edema  Data Reviewed: I have personally reviewed  following labs and imaging studies  CBC: Recent Labs  Lab 01/04/25 2207 01/04/25 2223 01/05/25 0525 01/06/25 0715 01/08/25 0715  WBC 12.4*  --  10.7* 8.3 5.8  NEUTROABS 10.4*  --   --   --   --   HGB 11.3* 11.9* 10.2* 9.3* 9.6*  HCT 30.3* 35.0* 27.8* 25.8* 27.8*  MCV 95.6  --  97.9 98.1 102.6*  PLT 389  --  357 358 477*   Basic Metabolic Panel: Recent Labs  Lab 01/05/25 0525 01/06/25 0715 01/06/25 1441 01/07/25 0831 01/07/25 1825 01/08/25 0715 01/09/25 0152  NA 134* 136 137 138  --  140 138  K 3.0* 2.5* 3.2* 3.2*  --  4.0 4.1  CL 96* 102 102 104  --  105 104  CO2 21* 23 25 24   --  28 26  GLUCOSE 138* 121* 160* 139*  --  106* 130*  BUN 35* 17 13 9   --  6 8  CREATININE 1.00 0.81 0.76 0.68  --  0.74 0.79  CALCIUM 9.1 8.9 8.9 8.8*  --  8.8* 8.5*  MG 2.2 1.6*  --  1.3*  --  1.9 1.7  PHOS 2.5 1.1*  --   --  1.3* 1.3* 3.4   GFR: Estimated Creatinine Clearance: 96.7 mL/min (by C-G formula based on SCr of 0.79 mg/dL). Liver Function Tests: Recent Labs  Lab 01/04/25 2207 01/05/25 0525  AST 74* 59*  ALT 36 30  ALKPHOS 130* 134*  BILITOT 0.7 0.5  PROT 7.3 6.5  ALBUMIN 3.9 3.5   Recent Labs  Lab 01/07/25 1825 01/08/25 0715 01/09/25 0152  LIPASE 135* 114* 114*   No results for input(s): AMMONIA in the last 168 hours. Coagulation Profile: Recent Labs  Lab 01/04/25 2252  INR 1.0   Cardiac Enzymes: No results for input(s): CKTOTAL, CKMB, CKMBINDEX, TROPONINI in the last 168 hours. BNP (last 3 results) No results for input(s): PROBNP in the last 8760 hours. HbA1C: No results for input(s): HGBA1C in the last 72 hours. CBG: Recent Labs  Lab 01/04/25 2240 01/05/25 0536 01/06/25 0759 01/07/25 0537 01/08/25 0605  GLUCAP 179* 134* 143* 119* 120*   Lipid Profile: No results for input(s): CHOL, HDL, LDLCALC, TRIG, CHOLHDL, LDLDIRECT in the last 72 hours. Thyroid Function Tests: No results for input(s): TSH, T4TOTAL, FREET4,  T3FREE, THYROIDAB in the last 72 hours.  Anemia Panel: No results for input(s): VITAMINB12, FOLATE, FERRITIN, TIBC, IRON, RETICCTPCT in the last 72 hours.  Sepsis Labs: Recent Labs  Lab 01/04/25 2220 01/05/25 0026  LATICACIDVEN 2.5* 0.7    Recent Results (from the past 240 hours)  Resp panel by RT-PCR (RSV, Flu A&B, Covid) Urine, Clean Catch     Status: None   Collection Time: 01/04/25 10:17 PM   Specimen: Urine, Clean Catch; Nasal Swab  Result Value Ref Range Status   SARS Coronavirus 2 by RT PCR NEGATIVE NEGATIVE Final   Influenza A by PCR NEGATIVE NEGATIVE Final   Influenza B by PCR NEGATIVE NEGATIVE Final    Comment: (NOTE) The Xpert Xpress SARS-CoV-2/FLU/RSV plus assay is intended  as an aid in the diagnosis of influenza from Nasopharyngeal swab specimens and should not be used as a sole basis for treatment. Nasal washings and aspirates are unacceptable for Xpert Xpress SARS-CoV-2/FLU/RSV testing.  Fact Sheet for Patients: bloggercourse.com  Fact Sheet for Healthcare Providers: seriousbroker.it  This test is not yet approved or cleared by the [place]  FDA and has been authorized for detection and/or diagnosis of SARS-CoV-2 by FDA under an Emergency Use Authorization (EUA). This EUA will remain in effect (meaning this test can be used) for the duration of the COVID-19 declaration under Section 564(b)(1) of the Act, 21 U.S.C. section 360bbb-3(b)(1), unless the authorization is terminated or revoked.     Resp Syncytial Virus by PCR NEGATIVE NEGATIVE Final    Comment: (NOTE) Fact Sheet for Patients: bloggercourse.com  Fact Sheet for Healthcare Providers: seriousbroker.it  This test is not yet approved or cleared by the [place]  FDA and has been authorized for detection and/or diagnosis of SARS-CoV-2 by FDA under an Emergency Use Authorization  (EUA). This EUA will remain in effect (meaning this test can be used) for the duration of the COVID-19 declaration under Section 564(b)(1) of the Act, 21 U.S.C. section 360bbb-3(b)(1), unless the authorization is terminated or revoked.  Performed at [facility], [address] [place], [place] [address]   Culture, blood (routine x 2)     Status: None (Preliminary result)   Collection Time: 01/04/25 10:52 PM   Specimen: BLOOD LEFT FOREARM  Result Value Ref Range Status   Specimen Description BLOOD LEFT FOREARM  Final   Special Requests   Final    BOTTLES DRAWN AEROBIC AND ANAEROBIC Blood Culture results may not be optimal due to an inadequate volume of blood received in culture bottles   Culture   Final    NO GROWTH 4 DAYS Performed at [facility], [address] [place], [place] [address]    Report Status PENDING  Incomplete  Culture, blood (routine x 2)     Status: None (Preliminary result)   Collection Time: 01/04/25 10:55 PM   Specimen: BLOOD  Result Value Ref Range Status   Specimen Description BLOOD SITE NOT SPECIFIED  Final   Special Requests   Final    BOTTLES DRAWN AEROBIC AND ANAEROBIC Blood Culture adequate volume   Culture   Final    NO GROWTH 4 DAYS Performed at [facility], [address], [place], [place] [address]    Report Status PENDING  Incomplete  C Difficile Quick Screen w PCR reflex     Status: None   Collection Time: 01/06/25 12:35 PM   Specimen: STOOL  Result Value Ref Range Status   C Diff antigen NEGATIVE NEGATIVE Final   C Diff toxin NEGATIVE NEGATIVE Final   C Diff interpretation No C. difficile detected.  Final    Comment: Performed at [facility], [address], [place], [place] [address]  Gastrointestinal Panel by PCR , Stool     Status: None   Collection Time: 01/06/25  2:58 PM   Specimen: Stool  Result Value Ref Range Status   Campylobacter species NOT DETECTED NOT DETECTED Final   Plesimonas shigelloides NOT  DETECTED NOT DETECTED Final   Salmonella species NOT DETECTED NOT DETECTED Final   Yersinia enterocolitica NOT DETECTED NOT DETECTED Final   Vibrio species NOT DETECTED NOT DETECTED Final   Vibrio cholerae NOT DETECTED NOT DETECTED Final   Enteroaggregative E coli (EAEC) NOT DETECTED  NOT DETECTED Final   Enteropathogenic E coli (EPEC) NOT DETECTED NOT DETECTED Final   Enterotoxigenic E coli (ETEC) NOT DETECTED NOT DETECTED Final   Shiga like toxin producing E coli (STEC) NOT DETECTED NOT DETECTED Final   Shigella/Enteroinvasive E coli (EIEC) NOT DETECTED NOT DETECTED Final   Cryptosporidium NOT DETECTED NOT DETECTED Final   Cyclospora cayetanensis NOT DETECTED NOT DETECTED Final   Entamoeba histolytica NOT DETECTED NOT DETECTED Final   Giardia lamblia NOT DETECTED NOT DETECTED Final   Adenovirus F40/41 NOT DETECTED NOT DETECTED Final   Astrovirus NOT DETECTED NOT DETECTED Final   Norovirus GI/GII NOT DETECTED NOT DETECTED Final   Rotavirus A NOT DETECTED NOT DETECTED Final   Sapovirus (I, II, IV, and V) NOT DETECTED NOT DETECTED Final    Comment: Performed at [facility], [address], [place], [place] [address]         Radiology Studies: CT ABDOMEN PELVIS WO CONTRAST Result Date: 01/08/2025 CLINICAL DATA:  Abdominal pain. EXAM: CT ABDOMEN AND PELVIS WITHOUT CONTRAST TECHNIQUE: Multidetector CT imaging of the abdomen and pelvis was performed following the standard protocol without IV contrast. RADIATION DOSE REDUCTION: This exam was performed according to the departmental dose-optimization program which includes automated exposure control, adjustment of the mA and/or kV according to patient size and/or use of iterative reconstruction technique. COMPARISON:  None Available. FINDINGS: Lower chest: Patchy opacity in the lung bases likely atelectatic although infectious/inflammatory etiology is not entirely excluded. Hepatobiliary: The liver shows diffusely decreased attenuation  suggesting fat deposition. No suspicious focal abnormality in the liver on this study without intravenous contrast. There is no evidence for gallstones, gallbladder wall thickening, or pericholecystic fluid. No intrahepatic or extrahepatic biliary dilation. Pancreas: No focal mass lesion. No dilatation of the main duct. No intraparenchymal cyst. No peripancreatic edema. Spleen: No splenomegaly. No suspicious focal mass lesion. Adrenals/Urinary Tract: No adrenal nodule or mass. 6 mm subcapsular hyperattenuating lesion in the lower interpolar right kidney cannot be definitively characterized but is likely a proteinaceous or hemorrhagic cyst No followup imaging is recommended. left kidney unremarkable. No evidence for hydroureter. The urinary bladder appears normal for the degree of distention. Stomach/Bowel: Stomach is unremarkable. No gastric wall thickening. No evidence of outlet obstruction. Duodenum is normally positioned as is the ligament of Treitz. No small bowel wall thickening. No small bowel dilatation. The terminal ileum is normal. The appendix is normal. No gross colonic mass. No colonic wall thickening. Vascular/Lymphatic: There is moderate atherosclerotic calcification of the abdominal aorta without aneurysm. There is no gastrohepatic or hepatoduodenal ligament lymphadenopathy. No retroperitoneal or mesenteric lymphadenopathy. No pelvic sidewall lymphadenopathy. Reproductive: The prostate gland and seminal vesicles are unremarkable. Other: No substantial intraperitoneal free fluid. Musculoskeletal: No worrisome lytic or sclerotic osseous abnormality. IMPRESSION: 1. No acute findings in the abdomen or pelvis. Specifically, no findings to explain the patient's history of abdominal pain. 2. Hepatic steatosis. 3. Patchy opacity in the lung bases likely atelectatic although infectious/inflammatory etiology is not entirely excluded. 4.  Aortic Atherosclerosis (ICD10-I70.0). Electronically Signed   By: Camellia Candle M.D.   On: 01/08/2025 05:18        Scheduled Meds:  chlordiazePOXIDE   25 mg Oral BID   doxycycline   100 mg Oral Q12H   enoxaparin  (LOVENOX ) injection  40 mg Subcutaneous Q24H   folic acid   1 mg Oral Daily   gabapentin   100 mg Oral TID   lidocaine   1 patch Transdermal Q24H   losartan   25 mg Oral Daily  metoprolol  succinate  25 mg Oral Daily   multivitamin with minerals  1 tablet Oral Daily   pantoprazole   40 mg Oral BID   saccharomyces boulardii  250 mg Oral BID   sodium chloride  flush  3 mL Intravenous Q12H   sucralfate   1 g Oral TID WC & HS   thiamine   100 mg Oral Daily   Or   thiamine   100 mg Intravenous Daily   Continuous Infusions:  lactated ringers  50 mL/hr at 01/09/25 1015     LOS: 4 days    Time spent: 35 Minutes    Damon Landsberg A Domonic Hiscox, MD [facility]   If 7PM-7AM, please contact night-coverage [email or web address]  01/09/2025, 12:45 PM   "

## 2025-01-10 LAB — CBC
HCT: 30 % — ABNORMAL LOW (ref 39.0–52.0)
Hemoglobin: 10.1 g/dL — ABNORMAL LOW (ref 13.0–17.0)
MCH: 35.2 pg — ABNORMAL HIGH (ref 26.0–34.0)
MCHC: 33.7 g/dL (ref 30.0–36.0)
MCV: 104.5 fL — ABNORMAL HIGH (ref 80.0–100.0)
Platelets: 512 10*3/uL — ABNORMAL HIGH (ref 150–400)
RBC: 2.87 MIL/uL — ABNORMAL LOW (ref 4.22–5.81)
RDW: 17.4 % — ABNORMAL HIGH (ref 11.5–15.5)
WBC: 5.8 10*3/uL (ref 4.0–10.5)
nRBC: 0 % (ref 0.0–0.2)

## 2025-01-10 LAB — BASIC METABOLIC PANEL WITH GFR
Anion gap: 10 (ref 5–15)
BUN: 5 mg/dL — ABNORMAL LOW (ref 6–20)
CO2: 27 mmol/L (ref 22–32)
Calcium: 9 mg/dL (ref 8.9–10.3)
Chloride: 104 mmol/L (ref 98–111)
Creatinine, Ser: 0.73 mg/dL (ref 0.61–1.24)
GFR, Estimated: >60 mL/min
Glucose, Bld: 154 mg/dL — ABNORMAL HIGH (ref 70–99)
Potassium: 3.7 mmol/L (ref 3.5–5.1)
Sodium: 141 mmol/L (ref 135–145)

## 2025-01-10 LAB — GLUCOSE, CAPILLARY: Glucose-Capillary: 112 mg/dL — ABNORMAL HIGH (ref 70–99)

## 2025-01-10 LAB — MAGNESIUM: Magnesium: 1.8 mg/dL (ref 1.7–2.4)

## 2025-01-10 LAB — VITAMIN B1: Vitamin B1 (Thiamine): 73 nmol/L (ref 66.5–200.0)

## 2025-01-10 MED ORDER — MAGNESIUM SULFATE 2 GM/50ML IV SOLN
2.0000 g | Freq: Once | INTRAVENOUS | Status: AC
  Administered 2025-01-10: 2 g via INTRAVENOUS
  Filled 2025-01-10: qty 50

## 2025-01-10 NOTE — Plan of Care (Signed)
 Pt is alert, pleasant, oriented x 4, afebrile, stable hemodynamically, no acute distress noted overnight. He is able to rest and sleep well. CIWA score is 0. Plan of care is reviewed.   Problem: Clinical Measurements: Goal: Ability to maintain clinical measurements within normal limits will improve Outcome: Progressing Goal: Will remain free from infection Outcome: Progressing Goal: Diagnostic test results will improve Outcome: Progressing Goal: Respiratory complications will improve Outcome: Progressing Goal: Cardiovascular complication will be avoided Outcome: Progressing   Problem: Activity: Goal: Risk for activity intolerance will decrease Outcome: Progressing   Problem: Nutrition: Goal: Adequate nutrition will be maintained Outcome: Progressing   Problem: Coping: Goal: Level of anxiety will decrease Outcome: Progressing   Problem: Pain Managment: Goal: General experience of comfort will improve and/or be controlled Outcome: Progressing   Wendi Dash, RN

## 2025-01-10 NOTE — Plan of Care (Signed)
 His close friend came to visit him, asking about his status, explained his condition after consent taken from patient about his information release. Also, pt told to add her on emergency contact list on his profile. Still she has some queries about his condition. Informed to MD and phone number provided.  Problem: Education: Goal: Knowledge of condition and prescribed therapy will improve Outcome: Progressing   Problem: Cardiac: Goal: Will achieve and/or maintain adequate cardiac output Outcome: Progressing   Problem: Physical Regulation: Goal: Complications related to the disease process, condition or treatment will be avoided or minimized Outcome: Progressing   Problem: Education: Goal: Knowledge of General Education information will improve Description: Including pain rating scale, medication(s)/side effects and non-pharmacologic comfort measures Outcome: Progressing   Problem: Clinical Measurements: Goal: Ability to maintain clinical measurements within normal limits will improve Outcome: Progressing   Problem: Clinical Measurements: Goal: Diagnostic test results will improve Outcome: Progressing   Problem: Clinical Measurements: Goal: Cardiovascular complication will be avoided Outcome: Progressing   Problem: Coping: Goal: Level of anxiety will decrease Outcome: Progressing   Problem: Skin Integrity: Goal: Risk for impaired skin integrity will decrease Outcome: Progressing   Problem: Respiratory: Goal: Ability to maintain adequate ventilation will improve Outcome: Progressing

## 2025-01-10 NOTE — Progress Notes (Signed)
 " PROGRESS NOTE    Damon Olson  FMW:[id number] DOB: 25-May-1976 DOA: 01/04/2025 PCP: Pcp, No   Brief Narrative: 49 year old with past medical history significant for depression, anxiety, alcohol abuse presented after loss of consciousness.  Patient reports a history of nausea vomiting lower abdominal pain on 1/17 symptoms resolved by 1/19.  He normally drinks a sixpack of beer every night after work, but has not had any alcohol in 3 days.  Denies any history of seizures.  Per EMS report patient was incontinent in urine.  Evaluation in the ED lactic acid 2.5, white blood cell 12, creatinine 1.5, magnesium  1.6 potassium 2.6.  CT head and cervical spine no acute findings.  Chest x-ray bibasilar airspace opacity concerning for pneumonia.  Patient admitted for syncope found to have cardiomyopathy ejection fraction 45%.  Syncope thought to be related to severe electrolyte abnormalities.  He was also treated for pneumonia.  His abdominal pain he thought to be related to gastritis esophagitis versus mild pancreatitis.  Patient vomited on 1/26.  Today he is feeling better plan to advance diet if he is able to tolerate diet he might be able to be discharged on 1/28  Assessment & Plan:   Principal Problem:   Syncope and collapse Active Problems:   Unspecified mood (affective) disorder   Hypokalemia   Hypomagnesemia   Pneumonia   Prolonged QT interval  1-Syncope versus Seizure - Presented after brief loss of consciousness, had urinary incontinence per EMS. -suspect Syncope in setting hypotension and severe hypokalemia.  -FU ECHO: abnormal ECHO EF 45--50 %, regional wall motion abnormalities. Will consult cardiology  -Treated  with IV fluids. Replete electrolytes.  MRI: negative for Stroke.  EEG negative for seizure.   Mild pancreatitis.  Diarrhea abdominal pain.  GI pathogen. negative CT abdomen pelvis negative Lipase level mildly elevated.  C diff negative.  Diarrhea improved.  1/16:  Vomited,  still having abdominal pain. - No further vomiting, tolerating clear diet.  Plan to advance to soft diet.  Gastritis, esophagitis; probably form Alcohol.  Continue PPI, Carafate .  Reports abdominal pain has improved and chest burning pain has also improved. He is willing to try soft diet today.  Pneumonia: - He reported productive cough, SIRS criteria, chest x-ray bibasilar infiltrates, leukocytosis white blood cell 12. -COVID, RSV, influenza negative. -Blood cultures no growth to date. - He completed 5 days of antibiotics  Alcohol abuse: - Monitor on CIWA  Cardiomyopathy;  EF 45 % Suspect ETOH related.  Cardiac MRI ordered. Negative for reversible ischemia.  Appreciate cardiology evaluation.  Started on Cozaar , metoprolol .   Hypokalemia, hypomagnesemia, Hypophosphatemia.  - Received IV and oral supplement.  -Replaced.    Prolonged QT: -Correct electrolyte  Weakness; generalized. FTT,   -B12 normal , TSH: 1.1, Thiamine  pending.  -nutrition consultation   Hip pain; negative for fracture  Estimated body mass index is 17.04 kg/m as calculated from the following:   Height as of this encounter: 6' (1.829 m).   Weight as of this encounter: 57 kg.   DVT prophylaxis: Lovenox  Code Status: Full code Family Communication: care dicussed with patient.  Disposition Plan:  Status is: Inpatient Remains inpatient appropriate because: Plan to discharge home tomorrow if he is able to tolerate diet    Consultants:  Cardiology  Procedures:  ECHO  Antimicrobials:    Subjective: He is feeling better today, he has been able to tolerate clear liquid diet today.  No further vomiting.  No more diarrhea.  He reports abdominal  pain has improved.  Burning chest pain much improved.  He is now able to drink without significant burning pain,    Objective: Vitals:   01/10/25 0453 01/10/25 0500 01/10/25 0724 01/10/25 1119  BP: 113/78  115/85 108/76  Pulse: 94  88 79   Resp: 18  16 18   Temp: 98.6 F (37 C)  98 F (36.7 C) 98.1 F (36.7 C)  TempSrc: Oral  Oral Oral  SpO2: 100%  100% 100%  Weight:  57 kg    Height:        Intake/Output Summary (Last 24 hours) at 01/10/2025 1259 Last data filed at 01/10/2025 1040 Gross per 24 hour  Intake 978.82 ml  Output --  Net 978.82 ml   Filed Weights   01/08/25 0500 01/09/25 0500 01/10/25 0500  Weight: 58.7 kg 59.9 kg 57 kg    Examination:  General exam: No acute distress Respiratory system: Clear to auscultation normal respiratory effort CVS; S 1, S 2 RRR Gastrointestinal system: Bowel sounds present, soft nontender nondistended Central nervous system: Alert conversant following commands Extremities: no edema  Data Reviewed: I have personally reviewed following labs and imaging studies  CBC: Recent Labs  Lab 01/04/25 2207 01/04/25 2223 01/05/25 0525 01/06/25 0715 01/08/25 0715 01/10/25 0546  WBC 12.4*  --  10.7* 8.3 5.8 5.8  NEUTROABS 10.4*  --   --   --   --   --   HGB 11.3* 11.9* 10.2* 9.3* 9.6* 10.1*  HCT 30.3* 35.0* 27.8* 25.8* 27.8* 30.0*  MCV 95.6  --  97.9 98.1 102.6* 104.5*  PLT 389  --  357 358 477* 512*   Basic Metabolic Panel: Recent Labs  Lab 01/05/25 0525 01/06/25 0715 01/06/25 1441 01/07/25 0831 01/07/25 1825 01/08/25 0715 01/09/25 0152 01/10/25 0546  NA 134* 136 137 138  --  140 138 141  K 3.0* 2.5* 3.2* 3.2*  --  4.0 4.1 3.7  CL 96* 102 102 104  --  105 104 104  CO2 21* 23 25 24   --  28 26 27   GLUCOSE 138* 121* 160* 139*  --  106* 130* 154*  BUN 35* 17 13 9   --  6 8 5*  CREATININE 1.00 0.81 0.76 0.68  --  0.74 0.79 0.73  CALCIUM 9.1 8.9 8.9 8.8*  --  8.8* 8.5* 9.0  MG 2.2 1.6*  --  1.3*  --  1.9 1.7 1.8  PHOS 2.5 1.1*  --   --  1.3* 1.3* 3.4  --    GFR: Estimated Creatinine Clearance: 92 mL/min (by C-G formula based on SCr of 0.73 mg/dL). Liver Function Tests: Recent Labs  Lab 01/04/25 2207 01/05/25 0525  AST 74* 59*  ALT 36 30  ALKPHOS 130* 134*   BILITOT 0.7 0.5  PROT 7.3 6.5  ALBUMIN 3.9 3.5   Recent Labs  Lab 01/07/25 1825 01/08/25 0715 01/09/25 0152  LIPASE 135* 114* 114*   No results for input(s): AMMONIA in the last 168 hours. Coagulation Profile: Recent Labs  Lab 01/04/25 2252  INR 1.0   Cardiac Enzymes: No results for input(s): CKTOTAL, CKMB, CKMBINDEX, TROPONINI in the last 168 hours. BNP (last 3 results) No results for input(s): PROBNP in the last 8760 hours. HbA1C: No results for input(s): HGBA1C in the last 72 hours. CBG: Recent Labs  Lab 01/05/25 0536 01/06/25 0759 01/07/25 0537 01/08/25 0605 01/10/25 0453  GLUCAP 134* 143* 119* 120* 112*   Lipid Profile: No results for input(s): CHOL, HDL,  LDLCALC, TRIG, CHOLHDL, LDLDIRECT in the last 72 hours. Thyroid Function Tests: No results for input(s): TSH, T4TOTAL, FREET4, T3FREE, THYROIDAB in the last 72 hours.  Anemia Panel: No results for input(s): VITAMINB12, FOLATE, FERRITIN, TIBC, IRON, RETICCTPCT in the last 72 hours.  Sepsis Labs: Recent Labs  Lab 01/04/25 2220 01/05/25 0026  LATICACIDVEN 2.5* 0.7    Recent Results (from the past 240 hours)  Resp panel by RT-PCR (RSV, Flu A&B, Covid) Urine, Clean Catch     Status: None   Collection Time: 01/04/25 10:17 PM   Specimen: Urine, Clean Catch; Nasal Swab  Result Value Ref Range Status   SARS Coronavirus 2 by RT PCR NEGATIVE NEGATIVE Final   Influenza A by PCR NEGATIVE NEGATIVE Final   Influenza B by PCR NEGATIVE NEGATIVE Final    Comment: (NOTE) The Xpert Xpress SARS-CoV-2/FLU/RSV plus assay is intended as an aid in the diagnosis of influenza from Nasopharyngeal swab specimens and should not be used as a sole basis for treatment. Nasal washings and aspirates are unacceptable for Xpert Xpress SARS-CoV-2/FLU/RSV testing.  Fact Sheet for Patients: bloggercourse.com  Fact Sheet for Healthcare  Providers: seriousbroker.it  This test is not yet approved or cleared by the [place]  FDA and has been authorized for detection and/or diagnosis of SARS-CoV-2 by FDA under an Emergency Use Authorization (EUA). This EUA will remain in effect (meaning this test can be used) for the duration of the COVID-19 declaration under Section 564(b)(1) of the Act, 21 U.S.C. section 360bbb-3(b)(1), unless the authorization is terminated or revoked.     Resp Syncytial Virus by PCR NEGATIVE NEGATIVE Final    Comment: (NOTE) Fact Sheet for Patients: bloggercourse.com  Fact Sheet for Healthcare Providers: seriousbroker.it  This test is not yet approved or cleared by the [place]  FDA and has been authorized for detection and/or diagnosis of SARS-CoV-2 by FDA under an Emergency Use Authorization (EUA). This EUA will remain in effect (meaning this test can be used) for the duration of the COVID-19 declaration under Section 564(b)(1) of the Act, 21 U.S.C. section 360bbb-3(b)(1), unless the authorization is terminated or revoked.  Performed at [facility], [address], [place], [place] [address]   Culture, blood (routine x 2)     Status: None   Collection Time: 01/04/25 10:52 PM   Specimen: BLOOD LEFT FOREARM  Result Value Ref Range Status   Specimen Description BLOOD LEFT FOREARM  Final   Special Requests   Final    BOTTLES DRAWN AEROBIC AND ANAEROBIC Blood Culture results may not be optimal due to an inadequate volume of blood received in culture bottles   Culture   Final    NO GROWTH 5 DAYS Performed at [facility], [address], [place], [place] [address]    Report Status 01/09/2025 FINAL  Final  Culture, blood (routine x 2)     Status: None   Collection Time: 01/04/25 10:55 PM   Specimen: BLOOD  Result Value Ref Range Status   Specimen Description BLOOD SITE NOT SPECIFIED  Final    Special Requests   Final    BOTTLES DRAWN AEROBIC AND ANAEROBIC Blood Culture adequate volume   Culture   Final    NO GROWTH 5 DAYS Performed at [facility], [address], [place], [place] [address]    Report Status 01/09/2025 FINAL  Final  C Difficile Quick Screen w PCR reflex     Status: None   Collection Time: 01/06/25 12:35  PM   Specimen: STOOL  Result Value Ref Range Status   C Diff antigen NEGATIVE NEGATIVE Final   C Diff toxin NEGATIVE NEGATIVE Final   C Diff interpretation No C. difficile detected.  Final    Comment: Performed at [facility], [address] [place], [place] [address]  Gastrointestinal Panel by PCR , Stool     Status: None   Collection Time: 01/06/25  2:58 PM   Specimen: Stool  Result Value Ref Range Status   Campylobacter species NOT DETECTED NOT DETECTED Final   Plesimonas shigelloides NOT DETECTED NOT DETECTED Final   Salmonella species NOT DETECTED NOT DETECTED Final   Yersinia enterocolitica NOT DETECTED NOT DETECTED Final   Vibrio species NOT DETECTED NOT DETECTED Final   Vibrio cholerae NOT DETECTED NOT DETECTED Final   Enteroaggregative E coli (EAEC) NOT DETECTED NOT DETECTED Final   Enteropathogenic E coli (EPEC) NOT DETECTED NOT DETECTED Final   Enterotoxigenic E coli (ETEC) NOT DETECTED NOT DETECTED Final   Shiga like toxin producing E coli (STEC) NOT DETECTED NOT DETECTED Final   Shigella/Enteroinvasive E coli (EIEC) NOT DETECTED NOT DETECTED Final   Cryptosporidium NOT DETECTED NOT DETECTED Final   Cyclospora cayetanensis NOT DETECTED NOT DETECTED Final   Entamoeba histolytica NOT DETECTED NOT DETECTED Final   Giardia lamblia NOT DETECTED NOT DETECTED Final   Adenovirus F40/41 NOT DETECTED NOT DETECTED Final   Astrovirus NOT DETECTED NOT DETECTED Final   Norovirus GI/GII NOT DETECTED NOT DETECTED Final   Rotavirus A NOT DETECTED NOT DETECTED Final   Sapovirus (I, II, IV, and V) NOT DETECTED NOT DETECTED Final    Comment:  Performed at [facility], [address], [place], [place] [address]         Radiology Studies: DG HIP UNILAT WITH PELVIS 2-3 VIEWS LEFT Result Date: 01/09/2025 CLINICAL DATA:  [id number] Hip pain [id number]. EXAM: DG HIP (WITH OR WITHOUT PELVIS) 2-3V LEFT; DG HIP (WITH OR WITHOUT PELVIS) 2-3V RIGHT COMPARISON:  None Available. FINDINGS: Pelvis is intact with normal and symmetric sacroiliac joints. No acute fracture or dislocation. No aggressive osseous lesion. Visualized sacral arcuate lines are unremarkable. Unremarkable symphysis pubis. Unremarkable bilateral hip joints. No radiopaque foreign bodies. IMPRESSION: No acute osseous abnormality of bilateral hip joints. Electronically Signed   By: Ree Molt M.D.   On: 01/09/2025 16:18   DG HIP UNILAT WITH PELVIS 2-3 VIEWS RIGHT Result Date: 01/09/2025 CLINICAL DATA:  [id number] Hip pain [id number]. EXAM: DG HIP (WITH OR WITHOUT PELVIS) 2-3V LEFT; DG HIP (WITH OR WITHOUT PELVIS) 2-3V RIGHT COMPARISON:  None Available. FINDINGS: Pelvis is intact with normal and symmetric sacroiliac joints. No acute fracture or dislocation. No aggressive osseous lesion. Visualized sacral arcuate lines are unremarkable. Unremarkable symphysis pubis. Unremarkable bilateral hip joints. No radiopaque foreign bodies. IMPRESSION: No acute osseous abnormality of bilateral hip joints. Electronically Signed   By: Ree Molt M.D.   On: 01/09/2025 16:18        Scheduled Meds:  chlordiazePOXIDE   25 mg Oral BID   enoxaparin  (LOVENOX ) injection  40 mg Subcutaneous Q24H   folic acid   1 mg Oral Daily   gabapentin   100 mg Oral TID   lidocaine   1 patch Transdermal Q24H   losartan   25 mg Oral Daily   metoprolol  succinate  25 mg Oral Daily   multivitamin with minerals  1 tablet Oral Daily   pantoprazole   40 mg Oral BID   saccharomyces boulardii  250 mg Oral  BID   sodium chloride  flush  3 mL Intravenous Q12H   sucralfate   1 g Oral TID WC & HS   thiamine   100 mg Oral Daily    Or   thiamine   100 mg Intravenous Daily   Continuous Infusions:     LOS: 5 days    Time spent: 35 Minutes    Isatu Macinnes A Indalecio Malmstrom, MD [facility]   If 7PM-7AM, please contact night-coverage [email or web address]  01/10/2025, 12:59 PM   "

## 2025-01-11 DIAGNOSIS — K852 Alcohol induced acute pancreatitis without necrosis or infection: Secondary | ICD-10-CM

## 2025-01-11 LAB — GLUCOSE, CAPILLARY: Glucose-Capillary: 101 mg/dL — ABNORMAL HIGH (ref 70–99)

## 2025-01-11 MED ORDER — POLYETHYLENE GLYCOL 3350 17 G PO PACK
17.0000 g | PACK | Freq: Every day | ORAL | Status: DC
  Administered 2025-01-11 – 2025-01-12 (×2): 17 g via ORAL
  Filled 2025-01-11 (×2): qty 1

## 2025-01-11 MED ORDER — SENNOSIDES-DOCUSATE SODIUM 8.6-50 MG PO TABS
2.0000 | ORAL_TABLET | Freq: Two times a day (BID) | ORAL | Status: DC
  Administered 2025-01-11 – 2025-01-12 (×3): 2 via ORAL
  Filled 2025-01-11 (×3): qty 2

## 2025-01-11 NOTE — Plan of Care (Signed)
 Does not  have any issue throughout the daytime.  Problem: Physical Regulation: Goal: Complications related to the disease process, condition or treatment will be avoided or minimized Outcome: Progressing   Problem: Clinical Measurements: Goal: Ability to maintain clinical measurements within normal limits will improve Outcome: Progressing   Problem: Clinical Measurements: Goal: Will remain free from infection Outcome: Progressing   Problem: Clinical Measurements: Goal: Diagnostic test results will improve Outcome: Progressing   Problem: Activity: Goal: Risk for activity intolerance will decrease Outcome: Progressing   Problem: Nutrition: Goal: Adequate nutrition will be maintained Outcome: Progressing   Problem: Elimination: Goal: Will not experience complications related to bowel motility Outcome: Progressing   Problem: Elimination: Goal: Will not experience complications related to urinary retention Outcome: Progressing   Problem: Safety: Goal: Ability to remain free from injury will improve Outcome: Progressing   Problem: Education: Goal: Expressions of having a comfortable level of knowledge regarding the disease process will increase Outcome: Progressing

## 2025-01-11 NOTE — Progress Notes (Signed)
 " PROGRESS NOTE    Damon Olson  FMW:[id number] DOB: 11/06/1976 DOA: 01/04/2025 PCP: Pcp, No   Brief Narrative: 49 year old with past medical history significant for depression, anxiety, alcohol abuse presented after loss of consciousness.  Patient reports a history of nausea vomiting lower abdominal pain on 1/17 symptoms resolved by 1/19.  He normally drinks a sixpack of beer every night after work, but has not had any alcohol in 3 days.  Denies any history of seizures.  Per EMS report patient was incontinent in urine.  Evaluation in the ED lactic acid 2.5, white blood cell 12, creatinine 1.5, magnesium  1.6 potassium 2.6.  CT head and cervical spine no acute findings.  Chest x-ray bibasilar airspace opacity concerning for pneumonia.  Patient admitted for syncope found to have cardiomyopathy ejection fraction 45%.  Syncope thought to be related to severe electrolyte abnormalities.  He was also treated for pneumonia.  His abdominal pain he thought to be related to gastritis esophagitis versus mild pancreatitis.  Patient vomited on 1/26.  Diet was gradually advanced.  Assessment & Plan:  Syncope versus Seizure - Presented after brief loss of consciousness, had urinary incontinence per EMS. -suspect Syncope in setting hypotension and severe hypokalemia.  -ECHO: abnormal ECHO EF 45--50 %, regional wall motion abnormalities.  See below Patient was treated with IV fluids.  His electrolytes were corrected.  Underwent MRI brain which was negative for stroke.  EEG was negative for seizures.  Plan is for outpatient cardiac monitoring. No further recurrence in the hospital.  Continue to ambulate.  Mild alcoholic pancreatitis.  Diarrhea.  GI pathogen. negative CT abdomen pelvis negative Lipase level mildly elevated.  C diff negative.  Diarrhea improved.  Patient's emesis seems to have subsided though he does complain of abdominal pain during the course of the night.  Otherwise he feels well.  He  wants to see how he does with his meals today.  This is reasonable. Has not had a bowel movement in a few days.  Will initiate laxatives.  Gastritis, esophagitis; probably form Alcohol.  Continue PPI, Carafate .  Reports abdominal pain has improved and chest burning pain has also improved. Stable for the most part.  Pneumonia: - He reported productive cough, SIRS criteria, chest x-ray bibasilar infiltrates, leukocytosis white blood cell 12. -COVID, RSV, influenza negative. -Blood cultures no growth to date. - He has completed 5 days of antibiotics  Alcohol abuse: - Monitor on CIWA.  Patient has been counseled.  Requesting to speak with social worker again.  Cardiomyopathy;  EF 45 % Suspect ETOH related.  Cardiac MRI ordered which was Negative for reversible ischemia.  Seen by cardiology.  Patient started on ARB and metoprolol . They will arrange outpatient follow-up. Stable from a cardiac standpoint.  Hypokalemia, hypomagnesemia, Hypophosphatemia.  Supplemented  Prolonged QT: Secondary to electrolyte abnormalities.  Weakness; generalized. FTT,   -B12 normal , TSH: 1.1, Thiamine  73.0 which is in the normal range.   Hip pain; negative for fracture  Estimated body mass index is 16.98 kg/m as calculated from the following:   Height as of this encounter: 6' (1.829 m).   Weight as of this encounter: 56.8 kg.   DVT prophylaxis: Lovenox  Code Status: Full code Family Communication: care dicussed with patient.  Disposition Plan: Home when improved   Consultants:  Cardiology   Procedures:  ECHO  Subjective: Patient mentioned that he tolerated his supper but experiencing more abdominal discomfort this morning.  Denies any nausea this morning.  No chest pain or  shortness of breath.     Objective: Vitals:   01/10/25 2017 01/11/25 0002 01/11/25 0404 01/11/25 0500  BP: 113/83 102/75 106/86   Pulse: 93 84 77   Resp: 19 18 17    Temp: 98.2 F (36.8 C) 98.2 F (36.8 C)  98.2 F (36.8 C)   TempSrc: Oral  Oral   SpO2: 100% 100% 100%   Weight:    56.8 kg  Height:        Intake/Output Summary (Last 24 hours) at 01/11/2025 0844 Last data filed at 01/11/2025 0531 Gross per 24 hour  Intake 963 ml  Output --  Net 963 ml   Filed Weights   01/09/25 0500 01/10/25 0500 01/11/25 0500  Weight: 59.9 kg 57 kg 56.8 kg    Examination:  General appearance: Awake alert.  In no distress Resp: Clear to auscultation bilaterally.  Normal effort Cardio: S1-S2 is normal regular.  No S3-S4.  No rubs murmurs or bruit GI: Abdomen is soft.  Tender diffusely without any rebound rigidity or guarding.  No masses organomegaly.  Bowel sounds present but sluggish. No focal neurological deficits.  Data Reviewed: I have personally reviewed following labs and reports of imaging studies  CBC: Recent Labs  Lab 01/04/25 2207 01/04/25 2223 01/05/25 0525 01/06/25 0715 01/08/25 0715 01/10/25 0546  WBC 12.4*  --  10.7* 8.3 5.8 5.8  NEUTROABS 10.4*  --   --   --   --   --   HGB 11.3* 11.9* 10.2* 9.3* 9.6* 10.1*  HCT 30.3* 35.0* 27.8* 25.8* 27.8* 30.0*  MCV 95.6  --  97.9 98.1 102.6* 104.5*  PLT 389  --  357 358 477* 512*   Basic Metabolic Panel: Recent Labs  Lab 01/05/25 0525 01/06/25 0715 01/06/25 1441 01/07/25 0831 01/07/25 1825 01/08/25 0715 01/09/25 0152 01/10/25 0546  NA 134* 136 137 138  --  140 138 141  K 3.0* 2.5* 3.2* 3.2*  --  4.0 4.1 3.7  CL 96* 102 102 104  --  105 104 104  CO2 21* 23 25 24   --  28 26 27   GLUCOSE 138* 121* 160* 139*  --  106* 130* 154*  BUN 35* 17 13 9   --  6 8 5*  CREATININE 1.00 0.81 0.76 0.68  --  0.74 0.79 0.73  CALCIUM 9.1 8.9 8.9 8.8*  --  8.8* 8.5* 9.0  MG 2.2 1.6*  --  1.3*  --  1.9 1.7 1.8  PHOS 2.5 1.1*  --   --  1.3* 1.3* 3.4  --    GFR: Estimated Creatinine Clearance: 91.7 mL/min (by C-G formula based on SCr of 0.73 mg/dL).  Liver Function Tests: Recent Labs  Lab 01/04/25 2207 01/05/25 0525  AST 74* 59*  ALT 36 30   ALKPHOS 130* 134*  BILITOT 0.7 0.5  PROT 7.3 6.5  ALBUMIN 3.9 3.5   Recent Labs  Lab 01/07/25 1825 01/08/25 0715 01/09/25 0152  LIPASE 135* 114* 114*    Coagulation Profile: Recent Labs  Lab 01/04/25 2252  INR 1.0   CBG: Recent Labs  Lab 01/06/25 0759 01/07/25 0537 01/08/25 0605 01/10/25 0453 01/11/25 0526  GLUCAP 143* 119* 120* 112* 101*   Sepsis Labs: Recent Labs  Lab 01/04/25 2220 01/05/25 0026  LATICACIDVEN 2.5* 0.7    Recent Results (from the past 240 hours)  Resp panel by RT-PCR (RSV, Flu A&B, Covid) Urine, Clean Catch     Status: None   Collection Time: 01/04/25 10:17 PM  Specimen: Urine, Clean Catch; Nasal Swab  Result Value Ref Range Status   SARS Coronavirus 2 by RT PCR NEGATIVE NEGATIVE Final   Influenza A by PCR NEGATIVE NEGATIVE Final   Influenza B by PCR NEGATIVE NEGATIVE Final    Comment: (NOTE) The Xpert Xpress SARS-CoV-2/FLU/RSV plus assay is intended as an aid in the diagnosis of influenza from Nasopharyngeal swab specimens and should not be used as a sole basis for treatment. Nasal washings and aspirates are unacceptable for Xpert Xpress SARS-CoV-2/FLU/RSV testing.  Fact Sheet for Patients: bloggercourse.com  Fact Sheet for Healthcare Providers: seriousbroker.it  This test is not yet approved or cleared by the [place]  FDA and has been authorized for detection and/or diagnosis of SARS-CoV-2 by FDA under an Emergency Use Authorization (EUA). This EUA will remain in effect (meaning this test can be used) for the duration of the COVID-19 declaration under Section 564(b)(1) of the Act, 21 U.S.C. section 360bbb-3(b)(1), unless the authorization is terminated or revoked.     Resp Syncytial Virus by PCR NEGATIVE NEGATIVE Final    Comment: (NOTE) Fact Sheet for Patients: bloggercourse.com  Fact Sheet for Healthcare  Providers: seriousbroker.it  This test is not yet approved or cleared by the [place]  FDA and has been authorized for detection and/or diagnosis of SARS-CoV-2 by FDA under an Emergency Use Authorization (EUA). This EUA will remain in effect (meaning this test can be used) for the duration of the COVID-19 declaration under Section 564(b)(1) of the Act, 21 U.S.C. section 360bbb-3(b)(1), unless the authorization is terminated or revoked.  Performed at [facility], [address] [place], [place] [address]   Culture, blood (routine x 2)     Status: None   Collection Time: 01/04/25 10:52 PM   Specimen: BLOOD LEFT FOREARM  Result Value Ref Range Status   Specimen Description BLOOD LEFT FOREARM  Final   Special Requests   Final    BOTTLES DRAWN AEROBIC AND ANAEROBIC Blood Culture results may not be optimal due to an inadequate volume of blood received in culture bottles   Culture   Final    NO GROWTH 5 DAYS Performed at [facility], [address], [place], [place] [address]    Report Status 01/09/2025 FINAL  Final  Culture, blood (routine x 2)     Status: None   Collection Time: 01/04/25 10:55 PM   Specimen: BLOOD  Result Value Ref Range Status   Specimen Description BLOOD SITE NOT SPECIFIED  Final   Special Requests   Final    BOTTLES DRAWN AEROBIC AND ANAEROBIC Blood Culture adequate volume   Culture   Final    NO GROWTH 5 DAYS Performed at [facility], [address], [place], [place] [address]    Report Status 01/09/2025 FINAL  Final  C Difficile Quick Screen w PCR reflex     Status: None   Collection Time: 01/06/25 12:35 PM   Specimen: STOOL  Result Value Ref Range Status   C Diff antigen NEGATIVE NEGATIVE Final   C Diff toxin NEGATIVE NEGATIVE Final   C Diff interpretation No C. difficile detected.  Final    Comment: Performed at [facility], [address], [place], [place] [address]  Gastrointestinal Panel by  PCR , Stool     Status: None   Collection Time: 01/06/25  2:58 PM   Specimen: Stool  Result Value Ref Range Status   Campylobacter species NOT DETECTED NOT DETECTED Final  Plesimonas shigelloides NOT DETECTED NOT DETECTED Final   Salmonella species NOT DETECTED NOT DETECTED Final   Yersinia enterocolitica NOT DETECTED NOT DETECTED Final   Vibrio species NOT DETECTED NOT DETECTED Final   Vibrio cholerae NOT DETECTED NOT DETECTED Final   Enteroaggregative E coli (EAEC) NOT DETECTED NOT DETECTED Final   Enteropathogenic E coli (EPEC) NOT DETECTED NOT DETECTED Final   Enterotoxigenic E coli (ETEC) NOT DETECTED NOT DETECTED Final   Shiga like toxin producing E coli (STEC) NOT DETECTED NOT DETECTED Final   Shigella/Enteroinvasive E coli (EIEC) NOT DETECTED NOT DETECTED Final   Cryptosporidium NOT DETECTED NOT DETECTED Final   Cyclospora cayetanensis NOT DETECTED NOT DETECTED Final   Entamoeba histolytica NOT DETECTED NOT DETECTED Final   Giardia lamblia NOT DETECTED NOT DETECTED Final   Adenovirus F40/41 NOT DETECTED NOT DETECTED Final   Astrovirus NOT DETECTED NOT DETECTED Final   Norovirus GI/GII NOT DETECTED NOT DETECTED Final   Rotavirus A NOT DETECTED NOT DETECTED Final   Sapovirus (I, II, IV, and V) NOT DETECTED NOT DETECTED Final    Comment: Performed at [facility], [address] [place], [place] [address]     Radiology Studies: DG HIP UNILAT WITH PELVIS 2-3 VIEWS LEFT Result Date: 01/09/2025 CLINICAL DATA:  [id number] Hip pain 875026. EXAM: DG HIP (WITH OR WITHOUT PELVIS) 2-3V LEFT; DG HIP (WITH OR WITHOUT PELVIS) 2-3V RIGHT COMPARISON:  None Available. FINDINGS: Pelvis is intact with normal and symmetric sacroiliac joints. No acute fracture or dislocation. No aggressive osseous lesion. Visualized sacral arcuate lines are unremarkable. Unremarkable symphysis pubis. Unremarkable bilateral hip joints. No radiopaque foreign bodies. IMPRESSION: No acute osseous abnormality of  bilateral hip joints. Electronically Signed   By: Ree Molt M.D.   On: 01/09/2025 16:18   DG HIP UNILAT WITH PELVIS 2-3 VIEWS RIGHT Result Date: 01/09/2025 CLINICAL DATA:  [id number] Hip pain 875026. EXAM: DG HIP (WITH OR WITHOUT PELVIS) 2-3V LEFT; DG HIP (WITH OR WITHOUT PELVIS) 2-3V RIGHT COMPARISON:  None Available. FINDINGS: Pelvis is intact with normal and symmetric sacroiliac joints. No acute fracture or dislocation. No aggressive osseous lesion. Visualized sacral arcuate lines are unremarkable. Unremarkable symphysis pubis. Unremarkable bilateral hip joints. No radiopaque foreign bodies. IMPRESSION: No acute osseous abnormality of bilateral hip joints. Electronically Signed   By: Ree Molt M.D.   On: 01/09/2025 16:18    Scheduled Meds:  chlordiazePOXIDE   25 mg Oral BID   enoxaparin  (LOVENOX ) injection  40 mg Subcutaneous Q24H   folic acid   1 mg Oral Daily   gabapentin   100 mg Oral TID   lidocaine   1 patch Transdermal Q24H   losartan   25 mg Oral Daily   metoprolol  succinate  25 mg Oral Daily   multivitamin with minerals  1 tablet Oral Daily   pantoprazole   40 mg Oral BID   saccharomyces boulardii  250 mg Oral BID   sodium chloride  flush  3 mL Intravenous Q12H   sucralfate   1 g Oral TID WC & HS   thiamine   100 mg Oral Daily   Or   thiamine   100 mg Intravenous Daily   Continuous Infusions:   LOS: 6 days    Joette Pebbles, MD [facility]   If 7PM-7AM, please contact night-coverage [email or web address]  01/11/2025, 8:44 AM   "

## 2025-01-11 NOTE — Plan of Care (Signed)
" °  Problem: Education: Goal: Knowledge of condition and prescribed therapy will improve 01/11/2025 0541 by Tomie Jalaine Leonie JAYSON, RN Outcome: Progressing 01/11/2025 0533 by Tomie Jalaine Leonie JAYSON, RN Outcome: Progressing   Problem: Cardiac: Goal: Will achieve and/or maintain adequate cardiac output 01/11/2025 0541 by Tomie Jalaine Leonie JAYSON, RN Outcome: Progressing 01/11/2025 0533 by Tomie Jalaine Leonie JAYSON, RN Outcome: Progressing   Problem: Physical Regulation: Goal: Complications related to the disease process, condition or treatment will be avoided or minimized 01/11/2025 0541 by Tomie Jalaine Leonie JAYSON, RN Outcome: Progressing 01/11/2025 0533 by Tomie Jalaine Leonie JAYSON, RN Outcome: Progressing   Problem: Clinical Measurements: Goal: Ability to maintain clinical measurements within normal limits will improve 01/11/2025 0541 by Tomie Jalaine Leonie JAYSON, RN Outcome: Progressing 01/11/2025 0533 by Tomie Jalaine Leonie JAYSON, RN Outcome: Progressing   Problem: Coping: Goal: Level of anxiety will decrease Outcome: Progressing   Problem: Safety: Goal: Ability to remain free from injury will improve Outcome: Progressing   "

## 2025-01-12 ENCOUNTER — Telehealth (HOSPITAL_COMMUNITY): Payer: Self-pay

## 2025-01-12 ENCOUNTER — Other Ambulatory Visit (HOSPITAL_COMMUNITY): Payer: Self-pay

## 2025-01-12 LAB — COMPREHENSIVE METABOLIC PANEL WITH GFR
ALT: 33 U/L (ref 0–44)
AST: 36 U/L (ref 15–41)
Albumin: 3.3 g/dL — ABNORMAL LOW (ref 3.5–5.0)
Alkaline Phosphatase: 99 U/L (ref 38–126)
Anion gap: 10 (ref 5–15)
BUN: 8 mg/dL (ref 6–20)
CO2: 26 mmol/L (ref 22–32)
Calcium: 8.8 mg/dL — ABNORMAL LOW (ref 8.9–10.3)
Chloride: 106 mmol/L (ref 98–111)
Creatinine, Ser: 0.9 mg/dL (ref 0.61–1.24)
GFR, Estimated: >60 mL/min
Glucose, Bld: 119 mg/dL — ABNORMAL HIGH (ref 70–99)
Potassium: 4.3 mmol/L (ref 3.5–5.1)
Sodium: 142 mmol/L (ref 135–145)
Total Bilirubin: <0.2 mg/dL (ref 0.0–1.2)
Total Protein: 5.7 g/dL — ABNORMAL LOW (ref 6.5–8.1)

## 2025-01-12 LAB — CBC
HCT: 26.7 % — ABNORMAL LOW (ref 39.0–52.0)
Hemoglobin: 8.7 g/dL — ABNORMAL LOW (ref 13.0–17.0)
MCH: 34.8 pg — ABNORMAL HIGH (ref 26.0–34.0)
MCHC: 32.6 g/dL (ref 30.0–36.0)
MCV: 106.8 fL — ABNORMAL HIGH (ref 80.0–100.0)
Platelets: 463 10*3/uL — ABNORMAL HIGH (ref 150–400)
RBC: 2.5 MIL/uL — ABNORMAL LOW (ref 4.22–5.81)
RDW: 17.1 % — ABNORMAL HIGH (ref 11.5–15.5)
WBC: 6.7 10*3/uL (ref 4.0–10.5)
nRBC: 0 % (ref 0.0–0.2)

## 2025-01-12 LAB — GLUCOSE, CAPILLARY
Glucose-Capillary: 122 mg/dL — ABNORMAL HIGH (ref 70–99)
Glucose-Capillary: 99 mg/dL (ref 70–99)

## 2025-01-12 LAB — MAGNESIUM: Magnesium: 1.7 mg/dL (ref 1.7–2.4)

## 2025-01-12 MED ORDER — LOSARTAN POTASSIUM 25 MG PO TABS
25.0000 mg | ORAL_TABLET | Freq: Every day | ORAL | 0 refills | Status: AC
  Filled 2025-01-12: qty 30, 30d supply, fill #0

## 2025-01-12 MED ORDER — THIAMINE HCL 100 MG PO TABS
100.0000 mg | ORAL_TABLET | Freq: Every day | ORAL | 0 refills | Status: AC
  Filled 2025-01-12: qty 30, 30d supply, fill #0

## 2025-01-12 MED ORDER — OXYCODONE HCL 5 MG PO TABS
5.0000 mg | ORAL_TABLET | Freq: Four times a day (QID) | ORAL | 0 refills | Status: AC | PRN
  Filled 2025-01-12: qty 25, 7d supply, fill #0

## 2025-01-12 MED ORDER — SENNOSIDES-DOCUSATE SODIUM 8.6-50 MG PO TABS
2.0000 | ORAL_TABLET | Freq: Two times a day (BID) | ORAL | 0 refills | Status: AC
  Filled 2025-01-12: qty 60, 15d supply, fill #0

## 2025-01-12 MED ORDER — FOLIC ACID 1 MG PO TABS
1.0000 mg | ORAL_TABLET | Freq: Every day | ORAL | 0 refills | Status: AC
  Filled 2025-01-12: qty 30, 30d supply, fill #0

## 2025-01-12 MED ORDER — GABAPENTIN 300 MG PO CAPS
300.0000 mg | ORAL_CAPSULE | Freq: Every day | ORAL | 0 refills | Status: AC
  Filled 2025-01-12: qty 30, 30d supply, fill #0

## 2025-01-12 MED ORDER — PANTOPRAZOLE SODIUM 40 MG PO TBEC
40.0000 mg | DELAYED_RELEASE_TABLET | Freq: Every day | ORAL | 0 refills | Status: AC
  Filled 2025-01-12: qty 30, 30d supply, fill #0

## 2025-01-12 MED ORDER — POLYETHYLENE GLYCOL 3350 17 GM/SCOOP PO POWD
17.0000 g | Freq: Every day | ORAL | 0 refills | Status: AC
  Filled 2025-01-12: qty 238, 14d supply, fill #0

## 2025-01-12 MED ORDER — METOPROLOL SUCCINATE ER 25 MG PO TB24
25.0000 mg | ORAL_TABLET | Freq: Every day | ORAL | 0 refills | Status: AC
  Filled 2025-01-12: qty 30, 30d supply, fill #0

## 2025-01-12 NOTE — Telephone Encounter (Signed)
 Patient called and left voice mail. I checked the number on the voice mail and it was different than the number the patient left. Upon returning the call to the number the patient left [phone]), I got a voice mail for a car dealership in [place], [place] . I then called the number that the voice mail tagged as the number called from (and the patient's contact number in Epic) and reached the patient. The patient confirmed that the address in [place]  was correct, but he was relocating down here and he told a child psychotherapist he was calling us  to re-establish. I advised the patient to get an updated ID to reflect he is [facility] and then asked about his insurance. The patient has commercial [employer]. I gave the patient the number for [facility] and advised he would need a referral. The patient thanked me for the information and the call was ended.

## 2025-01-12 NOTE — Plan of Care (Signed)
" °  Problem: Education: Goal: Knowledge of condition and prescribed therapy will improve Outcome: Progressing   Problem: Cardiac: Goal: Will achieve and/or maintain adequate cardiac output Outcome: Progressing   Problem: Clinical Measurements: Goal: Ability to maintain clinical measurements within normal limits will improve Outcome: Progressing   Problem: Coping: Goal: Level of anxiety will decrease Outcome: Progressing   Problem: Pain Managment: Goal: General experience of comfort will improve and/or be controlled Outcome: Progressing   Problem: Safety: Goal: Ability to remain free from injury will improve Outcome: Progressing   "

## 2025-01-12 NOTE — Plan of Care (Signed)
" °  Problem: Education: Goal: Knowledge of condition and prescribed therapy will improve Outcome: Adequate for Discharge   Problem: Cardiac: Goal: Will achieve and/or maintain adequate cardiac output Outcome: Adequate for Discharge   Problem: Physical Regulation: Goal: Complications related to the disease process, condition or treatment will be avoided or minimized Outcome: Adequate for Discharge   Problem: Education: Goal: Knowledge of General Education information will improve Description: Including pain rating scale, medication(s)/side effects and non-pharmacologic comfort measures Outcome: Adequate for Discharge   Problem: Health Behavior/Discharge Planning: Goal: Ability to manage health-related needs will improve Outcome: Adequate for Discharge   Problem: Clinical Measurements: Goal: Ability to maintain clinical measurements within normal limits will improve Outcome: Adequate for Discharge Goal: Will remain free from infection Outcome: Adequate for Discharge Goal: Diagnostic test results will improve Outcome: Adequate for Discharge Goal: Respiratory complications will improve Outcome: Adequate for Discharge Goal: Cardiovascular complication will be avoided Outcome: Adequate for Discharge   Problem: Activity: Goal: Risk for activity intolerance will decrease Outcome: Adequate for Discharge   Problem: Nutrition: Goal: Adequate nutrition will be maintained Outcome: Adequate for Discharge   Problem: Coping: Goal: Level of anxiety will decrease Outcome: Adequate for Discharge   Problem: Elimination: Goal: Will not experience complications related to bowel motility Outcome: Adequate for Discharge Goal: Will not experience complications related to urinary retention Outcome: Adequate for Discharge   Problem: Pain Managment: Goal: General experience of comfort will improve and/or be controlled Outcome: Adequate for Discharge   Problem: Safety: Goal: Ability to  remain free from injury will improve Outcome: Adequate for Discharge   Problem: Skin Integrity: Goal: Risk for impaired skin integrity will decrease Outcome: Adequate for Discharge   Problem: Activity: Goal: Ability to tolerate increased activity will improve Outcome: Adequate for Discharge   Problem: Clinical Measurements: Goal: Ability to maintain a body temperature in the normal range will improve Outcome: Adequate for Discharge   Problem: Respiratory: Goal: Ability to maintain adequate ventilation will improve Outcome: Adequate for Discharge Goal: Ability to maintain a clear airway will improve Outcome: Adequate for Discharge   Problem: Education: Goal: Expressions of having a comfortable level of knowledge regarding the disease process will increase Outcome: Adequate for Discharge   Problem: Coping: Goal: Ability to adjust to condition or change in health will improve Outcome: Adequate for Discharge Goal: Ability to identify appropriate support needs will improve Outcome: Adequate for Discharge   Problem: Health Behavior/Discharge Planning: Goal: Compliance with prescribed medication regimen will improve Outcome: Adequate for Discharge   Problem: Medication: Goal: Risk for medication side effects will decrease Outcome: Adequate for Discharge   Problem: Clinical Measurements: Goal: Complications related to the disease process, condition or treatment will be avoided or minimized Outcome: Adequate for Discharge Goal: Diagnostic test results will improve Outcome: Adequate for Discharge   Problem: Safety: Goal: Verbalization of understanding the information provided will improve Outcome: Adequate for Discharge   Problem: Self-Concept: Goal: Level of anxiety will decrease Outcome: Adequate for Discharge Goal: Ability to verbalize feelings about condition will improve Outcome: Adequate for Discharge   "

## 2025-01-12 NOTE — TOC Transition Note (Signed)
 Transition of Care [facility]) - Discharge Note   Patient Details  Name: DONZELL COLLER MRN: [id number] Date of Birth: 1976/06/16  Transition [facility]) CM/SW Contact:  Almarie CHRISTELLA Goodie, LCSW Phone Number: 01/12/2025, 11:55 AM   Clinical Narrative:   CSW messaged by RN about patient asking to see CSW. CSW met with patient and discussed SA resources, patient in agreement to call and make appointments. Patient also asking about PCP, as the appointment was not on AVS. Patient's appointment had passed. CSW contacted [facility], rescheduled patient's PCP appointment. CSW obtained copy of patient's insurance card to send to admitting to add to patient's chart. No further inpatient care management needs at this time.    Final next level of care: Home/Self Care Barriers to Discharge: No Barriers Identified   Patient Goals and CMS Choice            Discharge Placement                       Discharge Plan and Services Additional resources added to the After Visit Summary for     Discharge Planning Services: CM Consult                                 Social Drivers of Health (SDOH) Interventions SDOH Screenings   Food Insecurity: No Food Insecurity (01/05/2025)  Housing: Low Risk (01/05/2025)  Transportation Needs: No Transportation Needs (01/05/2025)  Utilities: Not At Risk (01/05/2025)  Depression (PHQ2-9): Medium Risk (12/31/2021)  Tobacco Use: High Risk (01/05/2025)     Readmission Risk Interventions     No data to display

## 2025-01-12 NOTE — TOC Transition Note (Signed)
 Transition of Care [facility]) - Discharge Note   Patient Details  Name: Damon Olson MRN: [id number] Date of Birth: Feb 06, 1976  [facility] CM/SW Contact:  Corean JAYSON Canary, RN Phone Number: 01/12/2025, 9:20 AM   Clinical Narrative:    Patient will be DC today. May need transportation PCP appt previously made. No additional needs   Final next level of care: Home/Self Care Barriers to Discharge: No Barriers Identified   Patient Goals and CMS Choice            Discharge Placement                       Discharge Plan and Services Additional resources added to the After Visit Summary for     Discharge Planning Services: CM Consult                                 Social Drivers of Health (SDOH) Interventions SDOH Screenings   Food Insecurity: No Food Insecurity (01/05/2025)  Housing: Low Risk (01/05/2025)  Transportation Needs: No Transportation Needs (01/05/2025)  Utilities: Not At Risk (01/05/2025)  Depression (PHQ2-9): Medium Risk (12/31/2021)  Tobacco Use: High Risk (01/05/2025)     Readmission Risk Interventions     No data to display
